# Patient Record
Sex: Female | Born: 1985 | Hispanic: No | Marital: Married | Smoking: Former smoker
Health system: Southern US, Community
[De-identification: ages and names within clinical notes are randomized; demographics above are authoritative.]

## PROBLEM LIST (undated history)

## (undated) DIAGNOSIS — G629 Polyneuropathy, unspecified: Secondary | ICD-10-CM

## (undated) DIAGNOSIS — E559 Vitamin D deficiency, unspecified: Secondary | ICD-10-CM

## (undated) DIAGNOSIS — E669 Obesity, unspecified: Secondary | ICD-10-CM

## (undated) DIAGNOSIS — T7840XA Allergy, unspecified, initial encounter: Secondary | ICD-10-CM

## (undated) DIAGNOSIS — K589 Irritable bowel syndrome without diarrhea: Secondary | ICD-10-CM

## (undated) HISTORY — DX: Obesity, unspecified: E66.9

## (undated) HISTORY — PX: CHOLECYSTECTOMY: SHX55

## (undated) HISTORY — PX: ACNE CYST REMOVAL: SUR1112

## (undated) HISTORY — DX: Irritable bowel syndrome, unspecified: K58.9

## (undated) HISTORY — DX: Vitamin D deficiency, unspecified: E55.9

## (undated) HISTORY — DX: Polyneuropathy, unspecified: G62.9

---

## 2003-03-16 DIAGNOSIS — M1990 Unspecified osteoarthritis, unspecified site: Secondary | ICD-10-CM | POA: Insufficient documentation

## 2011-11-04 ENCOUNTER — Ambulatory Visit: Payer: Self-pay | Admitting: Internal Medicine

## 2013-01-10 IMAGING — CR RIGHT FOOT COMPLETE - 3+ VIEW
1 series · 3 of 3 positions shown · non-contrast
Comparison: none

REASON FOR EXAM: pain distal 4th/5th metatarsals after twisting injury
yesterday
COMMENTS:

PROCEDURE:     MDR - MDR FOOT RT COMP W/OBLIQUES  - November 04, 2011 [DATE]
RESULT:
There is a minimally displaced oblique fracture of the distal shaft of the
fifth metatarsal. No other fractures are seen.

[Series 1: ap · 0.17mm/px · 3 of 3 slices shown]
[im 1/3]
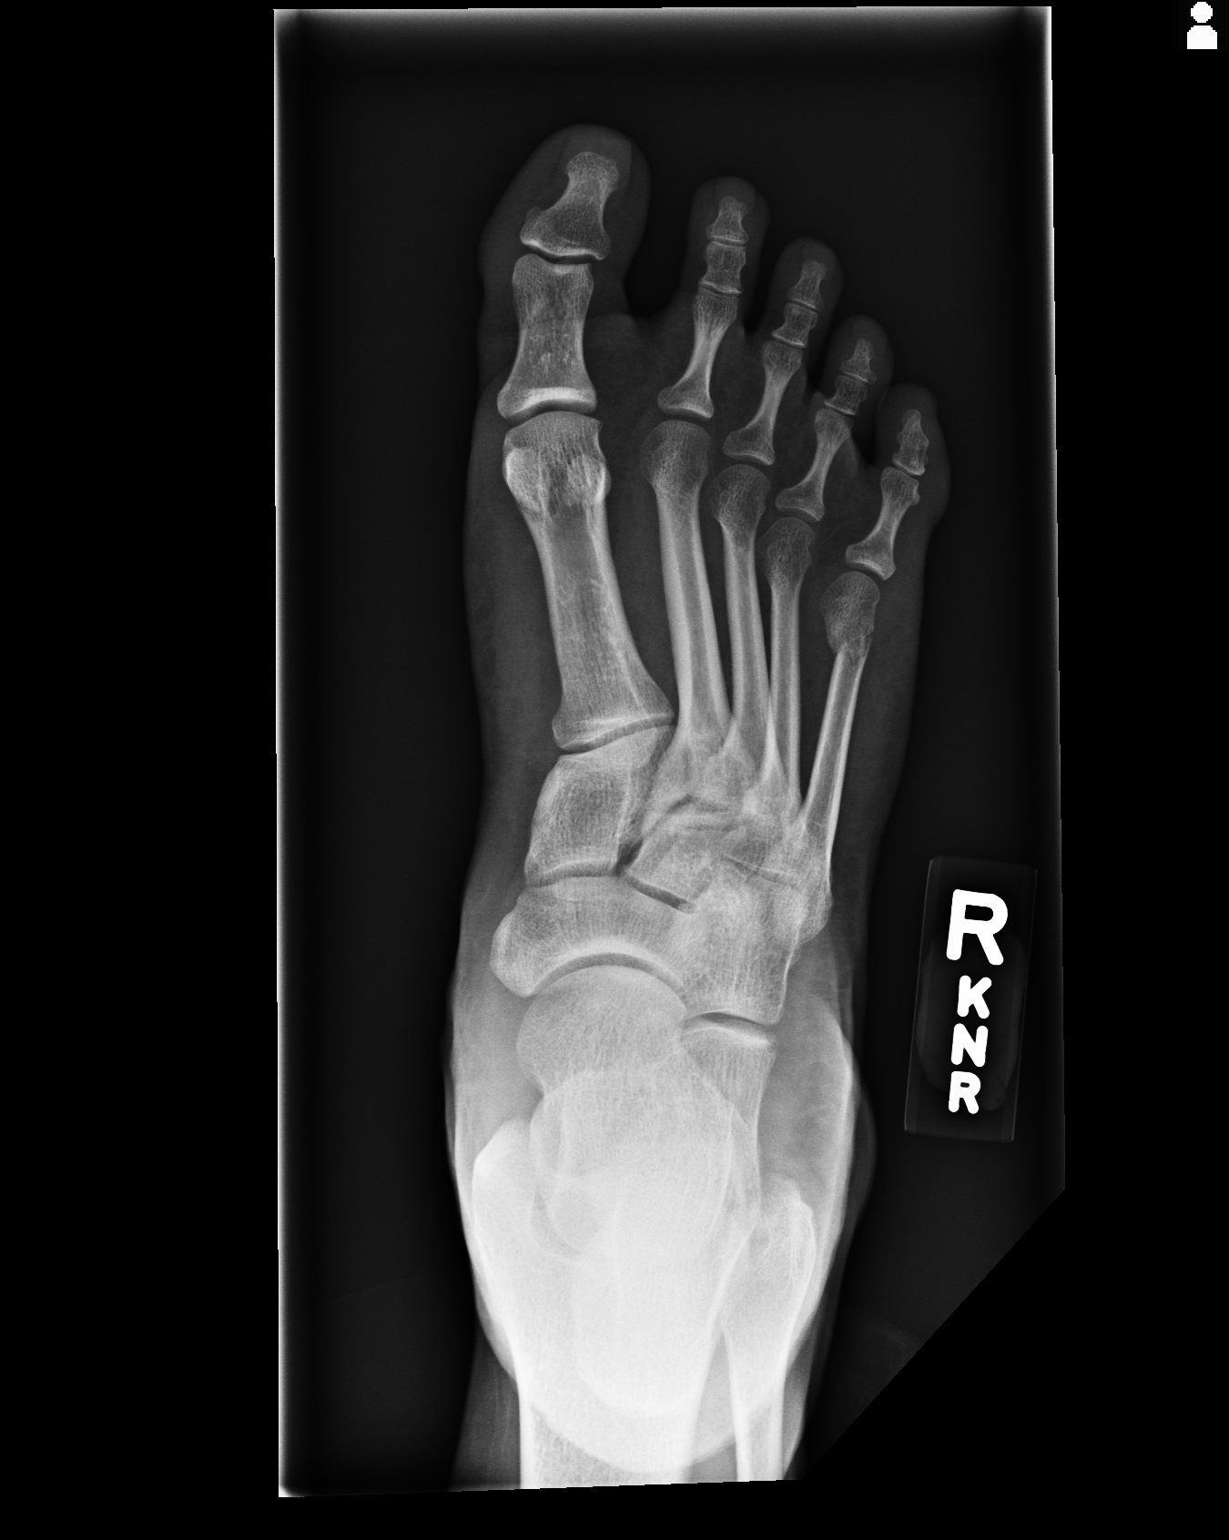
[im 2/3]
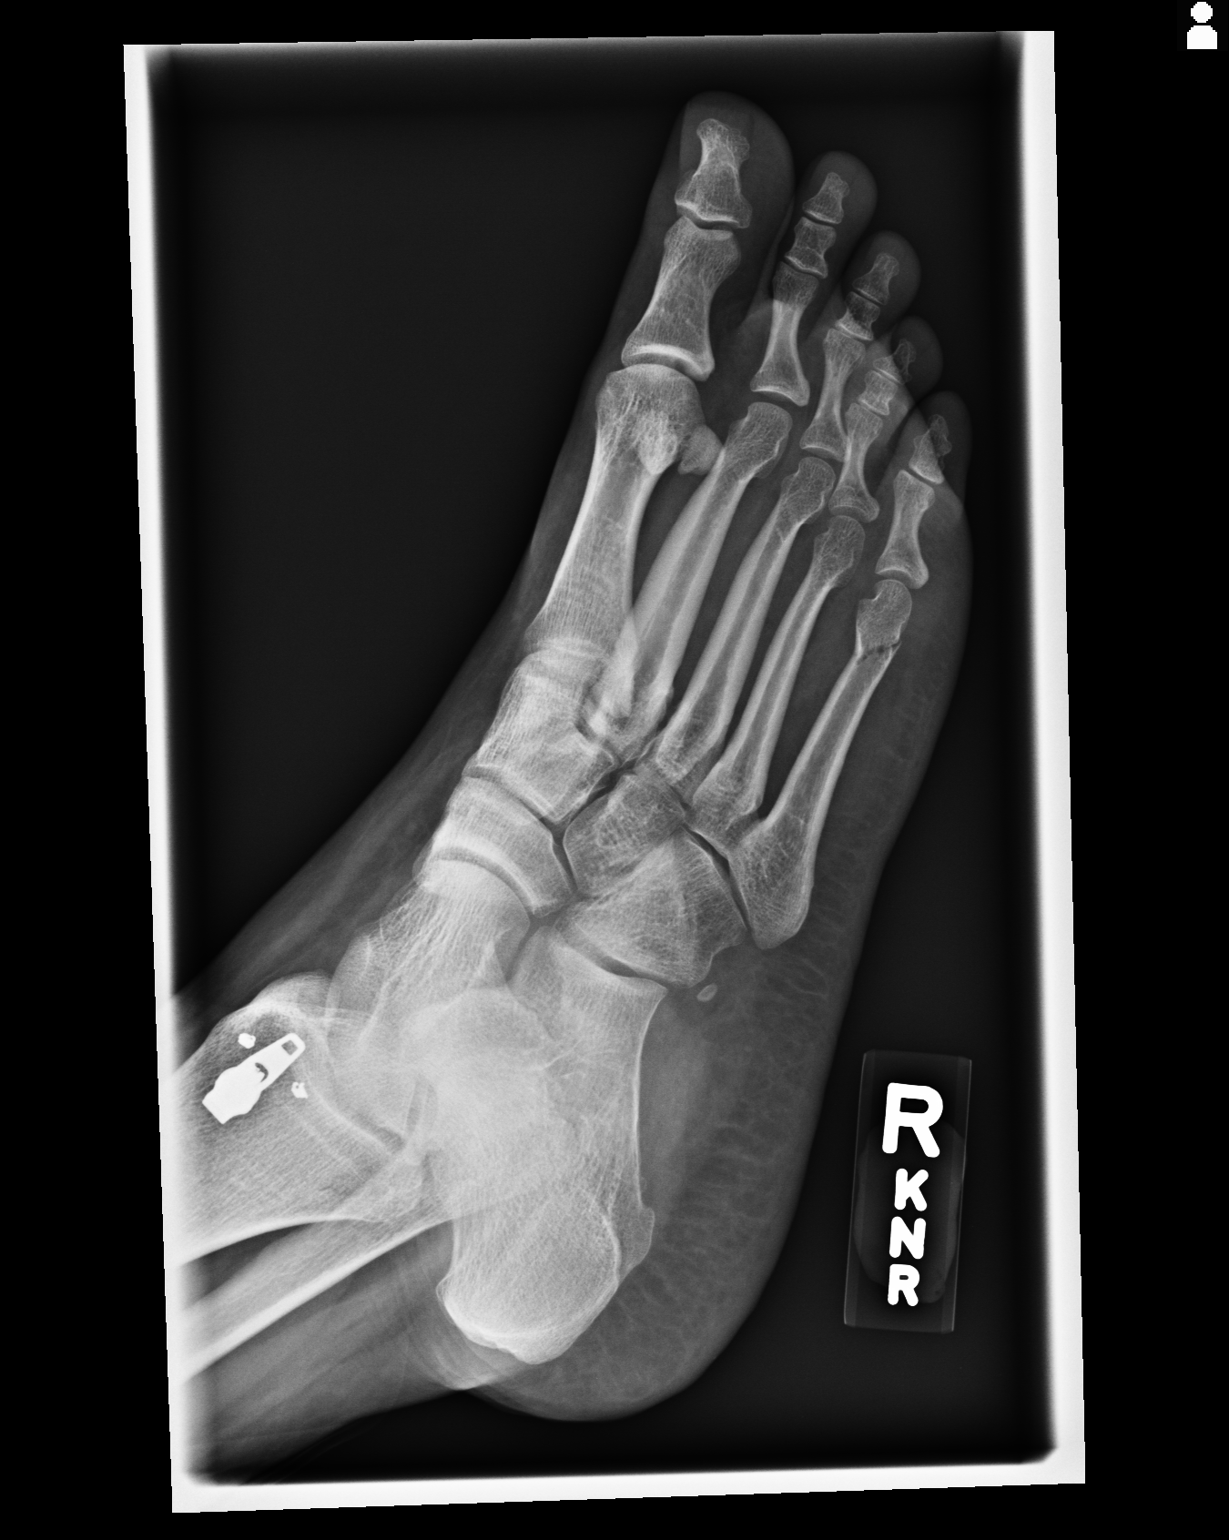
[im 3/3]
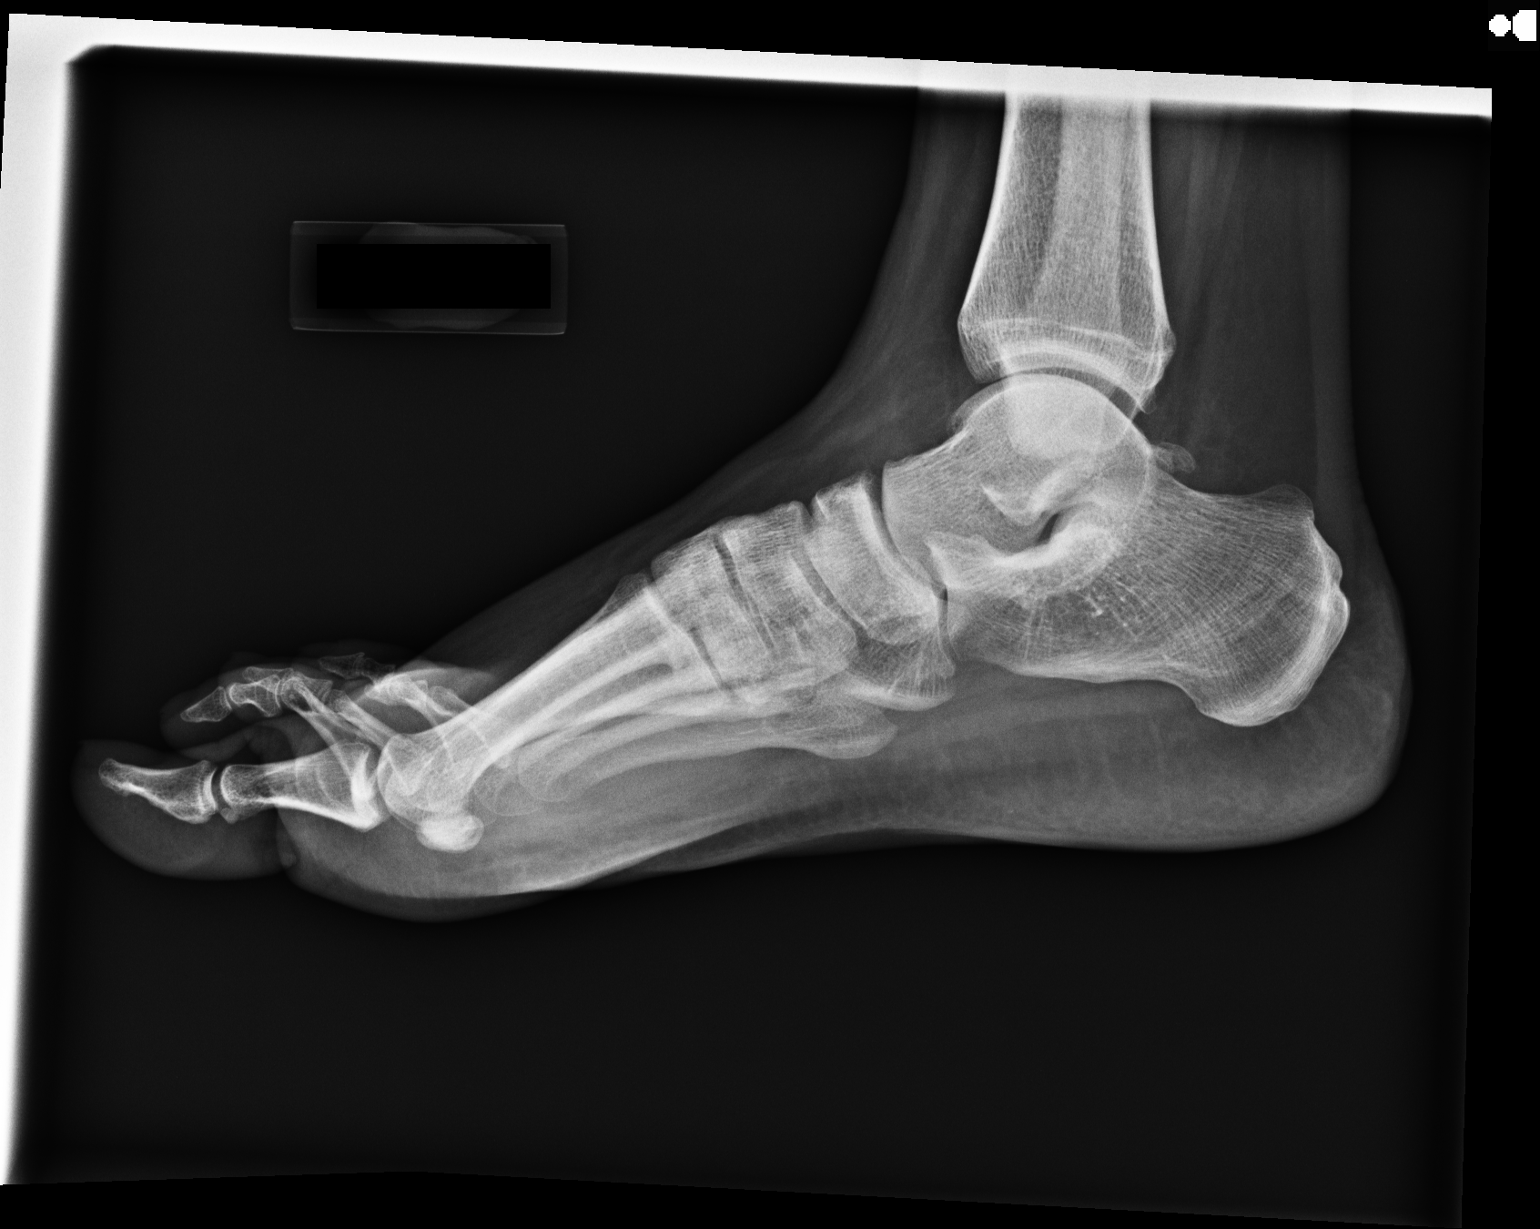

[3 of 3 positions shown; findings below may reference images not displayed]

IMPRESSION: Fracture of the distal right, fifth metatarsal, minimally
displaced.

## 2015-03-16 ENCOUNTER — Ambulatory Visit
Admission: EM | Admit: 2015-03-16 | Discharge: 2015-03-16 | Disposition: A | Discharge status: Home / Self Care | Payer: PRIVATE HEALTH INSURANCE | Attending: Physician Assistant | Admitting: Physician Assistant

## 2015-03-16 ENCOUNTER — Encounter: Payer: Self-pay | Admitting: Gynecology

## 2015-03-16 DIAGNOSIS — H109 Unspecified conjunctivitis: Secondary | ICD-10-CM | POA: Diagnosis not present

## 2015-03-16 HISTORY — DX: Allergy, unspecified, initial encounter: T78.40XA

## 2015-03-16 MED ORDER — MOXIFLOXACIN HCL 0.5 % OP SOLN: 1.0000 [drp] | Freq: Three times a day (TID) | OPHTHALMIC | Status: DC

## 2015-03-16 NOTE — ED Notes (Signed)
Per patient was seen at [facility] urgent care today. Per pt. Told she has allergies and was prescribe eye drops. Per pt. Refuse eye drops because it does not work for her. Patient stated eyes sensitive to light and painful / redness x 2 weeks.

## 2015-03-21 ENCOUNTER — Encounter: Payer: Self-pay | Admitting: Physician Assistant

## 2015-03-21 NOTE — ED Provider Notes (Signed)
CSN: [id number]     Arrival date & time 03/16/15  1825 History   None    Chief Complaint  Patient presents with  . Allergies  . Eye Pain   (Consider location/radiation/quality/duration/timing/severity/associated sxs/prior Treatment) HPI 29 yo F presents with bilaterally inflamed red eyes, clear tears draining. Reports they have looked and felt this bad for 2 weeks. Wearing contacts and has left them in. Doesn't want to use her glasses because can't see as well with them. Photophobic. Drove herself here.  Past Medical History  Diagnosis Date  . Allergy    Past Surgical History  Procedure Laterality Date  . Cholecystectomy     History reviewed. No pertinent family history. History  Substance Use Topics  . Smoking status: Former Games developermoker  . Smokeless tobacco: Not on file  . Alcohol Use: Yes     Comment: occassion   OB History    No data available     Review of Systems Review of 10 systems negative for acute change except as referenced in HPI  Allergies  Review of patient's allergies indicates no known allergies.  Home Medications   Prior to Admission medications   Medication Sig Start Date End Date Taking? Authorizing Provider  Cholecalciferol (VITAMIN D-3 PO) Take by mouth.   Yes Historical Provider, MD  Cyanocobalamin (B-12) 100 MCG TABS Take by mouth.   Yes Historical Provider, MD  fexofenadine (ALLEGRA) 180 MG tablet Take 180 mg by mouth daily.   Yes Historical Provider, MD  Multiple Vitamin (MULTIVITAMIN) capsule Take 1 capsule by mouth daily.   Yes Historical Provider, MD  triamcinolone (NASACORT AQ) 55 MCG/ACT AERO nasal inhaler Place 2 sprays into the nose daily.   Yes Historical Provider, MD  moxifloxacin (VIGAMOX) 0.5 % ophthalmic solution Place 1 drop into both eyes 3 (three) times daily. For seven days 03/16/15   Rae HalstedLaurie W Lee, PA-C   BP 128/91 mmHg  Pulse 79  Temp(Src) 99.1 F (37.3 C) (Tympanic)  Ht 5\' 8"  (1.727 m)  Wt 360 lb (163.295 kg)  BMI 54.75 kg/m2   SpO2 100%  LMP 02/14/2015 Physical Exam Constitutional -alert and oriented,in moderate distress eyes Head-atraumatic Eyes- conjunctiva inflamed bilaterally, injected, erythematous; eyelids ringed red;  EOMI ,conjugate gaze, Denies visual difficulty except decreased acuity with glasses ( routinely) Nose- no congestion or rhinorrhea Mouth/throat- mucous membranes moist , Neck- suppl CV- regular rate,  Resp-no distress, normal respiratory effort Neuro- normal speech and language,  Skin-warm,dry ,intact; no rash noted Psych-mood and affect grossly normal; speech and behavior grossly normal ED Course  Procedures (including critical care time) Eyes - bilateral instillation of tetracaine with marked improvement in discomfort. Copiously irrigated with saline. Additional single drop tetracaine bilaterally. Flurosceine. Black light. No evidence of corneal trauma or injury ; conjunctiva without uptake staining. Allergic conjunctivitis, contact lens irritation   Labs Review Labs Reviewed - No data to display  Imaging Review No results found.   MDM   1. Bilateral conjunctivitis    Diagnosis and treatment discussed.: Wear glasses only. Remove contacts and do not use until given clearance by Opthalmology -to be scheduled ASAP after the holiday weekend. Rx q 4h both eyes x 5-7 days or until clear plus one day . Suspect allergic conjunctivitis with  permeable lenses and pollen trapping with increased irritation of conjunctiva, secondary infection .  Questions fielded, expectations and recommendations reviewed. Patient expresses understanding. [facility] questions, concern or exacerbation.    Discharge Medication List as of 03/16/2015  8:18 PM    START taking these medications   Details  moxifloxacin (VIGAMOX) 0.5 % ophthalmic solution Place 1 drop into both eyes 3 (three) times daily. For seven days, Starting 03/16/2015, Until Discontinued, Print         Rae Halsted,  PA-C 03/21/15 1611

## 2015-06-06 DIAGNOSIS — J309 Allergic rhinitis, unspecified: Secondary | ICD-10-CM | POA: Insufficient documentation

## 2015-06-22 ENCOUNTER — Ambulatory Visit
Admission: RE | Admit: 2015-06-22 | Discharge: 2015-06-22 | Disposition: A | Discharge status: Home / Self Care | Payer: PRIVATE HEALTH INSURANCE | Source: Ambulatory Visit | Attending: Family Medicine | Admitting: Family Medicine

## 2015-06-22 ENCOUNTER — Other Ambulatory Visit: Payer: Self-pay | Admitting: Family Medicine

## 2015-06-22 DIAGNOSIS — R05 Cough: Secondary | ICD-10-CM

## 2015-06-22 DIAGNOSIS — R053 Chronic cough: Secondary | ICD-10-CM

## 2015-08-07 ENCOUNTER — Ambulatory Visit
Admission: EM | Admit: 2015-08-07 | Discharge: 2015-08-07 | Disposition: A | Discharge status: Home / Self Care | Payer: PRIVATE HEALTH INSURANCE | Attending: Family Medicine | Admitting: Family Medicine

## 2015-08-07 DIAGNOSIS — B349 Viral infection, unspecified: Secondary | ICD-10-CM

## 2015-08-07 DIAGNOSIS — J029 Acute pharyngitis, unspecified: Secondary | ICD-10-CM

## 2015-08-07 LAB — RAPID STREP SCREEN (MED CTR MEBANE ONLY): Streptococcus, Group A Screen (Direct): NEGATIVE

## 2015-08-07 NOTE — ED Notes (Signed)
Pt c/o sore throat with left earache since yesterday, fever last night

## 2015-08-07 NOTE — ED Provider Notes (Signed)
CSN: [id number]     Arrival date & time 08/07/15  1057 History   First MD Initiated Contact with Patient 08/07/15 1124     Chief Complaint  Patient presents with  . Sore Throat  . Otalgia   (Consider location/radiation/quality/duration/timing/severity/associated sxs/prior Treatment) HPI   This a 29 year old female who presents with a sore throat and left earache that started on Sunday and yesterday the earache became more severe. States she had a fever last night but is afebrile today in the office. States it is difficult to swallow in the left side of her throat seems to be the most sore but does extend over to the right. She has a history of seasonal and environmental allergies which is responding well to recent medications. 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 Past Medical History  Diagnosis Date  . Allergy    Past Surgical History  Procedure Laterality Date  . Cholecystectomy     History reviewed. No pertinent family history. Social History  Substance Use Topics  . Smoking status: Former Games developer  . Smokeless tobacco: None  . Alcohol Use: Yes     Comment: occassion   OB History    No data available     Review of Systems  Constitutional: Positive for fever and chills.  HENT: Positive for congestion, postnasal drip, rhinorrhea, sinus pressure, sore throat and trouble swallowing.   Eyes: Positive for pain.  Skin: Negative for rash.  Allergic/Immunologic: Positive for environmental allergies.  All other systems reviewed and are negative.   Allergies  Review of patient's allergies indicates no known allergies.  Home Medications   Prior to Admission medications   Medication Sig Start Date End Date Taking? Authorizing Provider  albuterol (PROVENTIL HFA;VENTOLIN HFA) 108 (90 BASE) MCG/ACT inhaler Inhale 2 puffs into the lungs every 4 (four) hours as needed for wheezing or shortness of breath.    Yes Historical Provider, MD  cetirizine-pseudoephedrine (ZYRTEC-D) 5-120 MG tablet Take 1 tablet by mouth 2 (two) times daily.   Yes Historical Provider, MD  montelukast (SINGULAIR) 10 MG tablet Take 10 mg by mouth at bedtime.   Yes Historical Provider, MD  ranitidine (ZANTAC) 150 MG capsule Take 150 mg by mouth 2 (two) times daily.   Yes Historical Provider, MD  Cholecalciferol (VITAMIN D-3 PO) Take by mouth.    Historical Provider, MD  Cyanocobalamin (B-12) 100 MCG TABS Take by mouth.    Historical Provider, MD  fexofenadine (ALLEGRA) 180 MG tablet Take 180 mg by mouth daily.    Historical Provider, MD  moxifloxacin (VIGAMOX) 0.5 % ophthalmic solution Place 1 drop into both eyes 3 (three) times daily. For seven days 03/16/15   Rae Halsted, PA-C  Multiple Vitamin (MULTIVITAMIN) capsule Take 1 capsule by mouth daily.    Historical Provider, MD  triamcinolone (NASACORT AQ) 55 MCG/ACT AERO nasal inhaler Place 2 sprays into the nose daily.    Historical Provider, MD   Meds Ordered and Administered this Visit  Medications - No data to display  BP 119/83 mmHg  Pulse 99  Temp(Src) 98.3 F (36.8 C) (Oral)  Resp 18  Ht  (1.702 m)  Wt 345 lb (156.491 kg)  BMI 54.02 kg/m2  SpO2 100%  LMP 07/14/2015 (Exact Date) No data found.   Physical Exam  Constitutional: She is oriented to person, place, and time. She appears well-developed and well-nourished. No distress.  HENT:  Head:  Normocephalic and atraumatic.  Right Ear: External ear normal.  Left Eardrum is injected and dull.  Eyes: Pupils are equal, round, and reactive to light. Right eye exhibits no discharge. Left eye exhibits no discharge.  Neck: Normal range of motion. Neck supple.  Pulmonary/Chest: Effort normal and breath sounds normal. No respiratory distress. She has no wheezes. She has no rales.  Musculoskeletal: Normal range of motion. She exhibits no edema or tenderness.  Lymphadenopathy:    She has no cervical adenopathy.   Neurological: She is alert and oriented to person, place, and time.  Skin: Skin is warm and dry. No rash noted. She is not diaphoretic.  Psychiatric: She has a normal mood and affect. Her behavior is normal. Judgment and thought content normal.  Nursing note and vitals reviewed.   ED Course  Procedures (including critical care time)  Labs Review Labs Reviewed  RAPID STREP SCREEN (NOT AT [facility])  CULTURE, GROUP A STREP ([facility] ONLY)    Imaging Review No results found.   Visual Acuity Review  Right Eye Distance:   Left Eye Distance:   Bilateral Distance:    Right Eye Near:   Left Eye Near:    Bilateral Near:         MDM   1. Pharyngitis with viral syndrome    Discharge Medication List as of 08/07/2015 11:53 AM    Plan: 1. Test/x-ray results and diagnosis reviewed with patient 2. rx as per orders; risks, benefits, potential side effects reviewed with patient 3. Recommend supportive treatment with salt water gargles, flonase, IBU PRN. 4. F/u prn if symptoms worsen or don't improve     Lutricia FeilWilliam P Cleland Simkins, PA-C 08/07/15 1159

## 2015-08-07 NOTE — Discharge Instructions (Signed)
Pharyngitis Pharyngitis is redness, pain, and swelling (inflammation) of your pharynx.  CAUSES  Pharyngitis is usually caused by infection. Most of the time, these infections are from viruses (viral) and are part of a cold. However, sometimes pharyngitis is caused by bacteria (bacterial). Pharyngitis can also be caused by allergies. Viral pharyngitis may be spread from person to person by coughing, sneezing, and personal items or utensils (cups, forks, spoons, toothbrushes). Bacterial pharyngitis may be spread from person to person by more intimate contact, such as kissing.  SIGNS AND SYMPTOMS  Symptoms of pharyngitis include:   Sore throat.   Tiredness (fatigue).   Low-grade fever.   Headache.  Joint pain and muscle aches.  Skin rashes.  Swollen lymph nodes.  Plaque-like film on throat or tonsils (often seen with bacterial pharyngitis). DIAGNOSIS  Your health care provider will ask you questions about your illness and your symptoms. Your medical history, along with a physical exam, is often all that is needed to diagnose pharyngitis. Sometimes, a rapid strep test is done. Other lab tests may also be done, depending on the suspected cause.  TREATMENT  Viral pharyngitis will usually get better in 3-4 days without the use of medicine. Bacterial pharyngitis is treated with medicines that kill germs (antibiotics).  HOME CARE INSTRUCTIONS   Drink enough water and fluids to keep your urine clear or pale yellow.   Only take over-the-counter or prescription medicines as directed by your health care provider:   If you are prescribed antibiotics, make sure you finish them even if you start to feel better.   Do not take aspirin.   Get lots of rest.   Gargle with 8 oz of salt water ( tsp of salt per 1 qt of water) as often as every 1-2 hours to soothe your throat.   Throat lozenges (if you are not at risk for choking) or sprays may be used to soothe your throat. SEEK MEDICAL  CARE IF:   You have large, tender lumps in your neck.  You have a rash.  You cough up green, yellow-brown, or bloody spit. SEEK IMMEDIATE MEDICAL CARE IF:   Your neck becomes stiff.  You drool or are unable to swallow liquids.  You vomit or are unable to keep medicines or liquids down.  You have severe pain that does not go away with the use of recommended medicines.  You have trouble breathing (not caused by a stuffy nose). MAKE SURE YOU:   Understand these instructions.  Will watch your condition.  Will get help right away if you are not doing well or get worse.   This information is not intended to replace advice given to you by your health care provider. Make sure you discuss any questions you have with your health care provider.   Document Released: 09/01/2005 Document Revised: 06/22/2013 Document Reviewed: 05/09/2013 Elsevier Interactive Patient Education 2016 Elsevier Inc.  Sore Throat A sore throat is pain, burning, irritation, or scratchiness of the throat. There is often pain or tenderness when swallowing or talking. A sore throat may be accompanied by other symptoms, such as coughing, sneezing, fever, and swollen neck glands. A sore throat is often the first sign of another sickness, such as a cold, flu, strep throat, or mononucleosis (commonly known as mono). Most sore throats go away without medical treatment. CAUSES  The most common causes of a sore throat include:  A viral infection, such as a cold, flu, or mono.  A bacterial infection, such as strep throat,  tonsillitis, or whooping cough. °· Seasonal allergies. °· Dryness in the air. °· Irritants, such as smoke or pollution. °· Gastroesophageal reflux disease (GERD). °HOME CARE INSTRUCTIONS  °· Only take over-the-counter medicines as directed by your caregiver. °· Drink enough fluids to keep your urine clear or pale yellow. °· Rest as needed. °· Try using throat sprays, lozenges, or sucking on hard candy to ease  any pain (if older than 4 years or as directed). °· Sip warm liquids, such as broth, herbal tea, or warm water with honey to relieve pain temporarily. You may also eat or drink cold or frozen liquids such as frozen ice pops. °· Gargle with salt water (mix 1 tsp salt with 8 oz of water). °· Do not smoke and avoid secondhand smoke. °· Put a cool-mist humidifier in your bedroom at night to moisten the air. You can also turn on a hot shower and sit in the bathroom with the door closed for 5-10 minutes. °SEEK IMMEDIATE MEDICAL CARE IF: °· You have difficulty breathing. °· You are unable to swallow fluids, soft foods, or your saliva. °· You have increased swelling in the throat. °· Your sore throat does not get better in 7 days. °· You have nausea and vomiting. °· You have a fever or persistent symptoms for more than 2-3 days. °· You have a fever and your symptoms suddenly get worse. °MAKE SURE YOU:  °· Understand these instructions. °· Will watch your condition. °· Will get help right away if you are not doing well or get worse. °  °This information is not intended to replace advice given to you by your health care provider. Make sure you discuss any questions you have with your health care provider. °  °Document Released: 10/09/2004 Document Revised: 09/22/2014 Document Reviewed: 05/09/2012 °Elsevier Interactive Patient Education ©2016 Elsevier Inc. ° °

## 2015-08-09 LAB — CULTURE, GROUP A STREP (THRC)

## 2015-11-05 DIAGNOSIS — S62615A Displaced fracture of proximal phalanx of left ring finger, initial encounter for closed fracture: Secondary | ICD-10-CM | POA: Insufficient documentation

## 2015-12-31 ENCOUNTER — Ambulatory Visit: Payer: Worker's Compensation | Attending: Student | Admitting: Occupational Therapy

## 2015-12-31 DIAGNOSIS — M25642 Stiffness of left hand, not elsewhere classified: Secondary | ICD-10-CM | POA: Diagnosis not present

## 2015-12-31 DIAGNOSIS — M79642 Pain in left hand: Secondary | ICD-10-CM | POA: Insufficient documentation

## 2015-12-31 DIAGNOSIS — M6281 Muscle weakness (generalized): Secondary | ICD-10-CM | POA: Diagnosis present

## 2015-12-31 NOTE — Patient Instructions (Signed)
Heat prior to ROM  PIP extention PROM and AROM on table   AROM DIP and PIP flexion block at 4th and 5th  tendonglides   10 reps 2-3 x day  Opposition with sliding digits one at time down thumb

## 2015-12-31 NOTE — Therapy (Signed)
[facility] PH[facility] AND SP[facility] MEDICINE [address] [place], [place], [address] Phone: [phone]   Fax:  [phone]  Occupational Therapy Treatment  Patient Details  Name: Karen Schroeder MRN: [id number] Date of Birth: 04/26/1986 Referring Provider: Marney DoctorMcGhee  Encounter Date: 12/31/2015      OT End of Session - 12/31/15 1339    Visit Number 1   Number of Visits 6   Date for OT Re-Evaluation 02/11/16   OT Start Time 0815   OT Stop Time 0857   OT Time Calculation (min) 42 min   Activity Tolerance Patient tolerated treatment well   Behavior During Therapy [facility] for tasks assessed/performed      Past Medical History  Diagnosis Date  . Allergy     Past Surgical History  Procedure Laterality Date  . Cholecystectomy      There were no vitals filed for this visit.      Subjective Assessment - 12/31/15 0856    Subjective  Fracture finger about 9 wks ago - was in splint and then buddy strap - was taken out of that on 7th April - pain getting better    Patient Stated Goals Want to get my motion better - make fist, fix my hair, pick up ojbects, cleaning like scrubbing,  tying shoes, open jar   Currently in Pain? No/denies            [facility] OT Assessment - 12/31/15 0001    Assessment   Diagnosis L 4th prox phalanges fx    Referring Provider McGhee   Onset Date 10/23/15   Assessment Pt grasp onto railing coming down stairs at client house - was seen by orthopedics  - was in extention splint , buddy strap - now refer to occupational therapy   Balance Screen   Has the patient fallen in the past 6 months No   Has the patient had a decrease in activity level because of a fear of falling?  No   Is the patient reluctant to leave their home because of a fear of falling?  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[employer], R hand dominant -  typing , on computer , table and reading a lot    Strength   Right Hand Grip (lbs) 50   Right Hand Lateral Pinch 17 lbs   Right Hand 3 Point Pinch 19 lbs   Left Hand Grip (lbs) 25   Left Hand Lateral Pinch 16 lbs   Left Hand 3 Point Pinch 16 lbs   Left Hand AROM   L Ring  MCP 0-90 72 Degrees   L Ring PIP 0-100 80 Degrees  -22   L Ring DIP 0-70 30 Degrees   L Little  MCP 0-90 85 Degrees   L Little PIP 0-100 75 Degrees         Fluidotherapy prior to review of HEP to increase ROM and decrease pain - 5th showed excellent progress - 4th needed some PROM  HEP reviewed - hand out  Provided                  OT Education - 12/31/15 1339    Education provided Yes   Education Details HEP and findings of eval    Person(s) Educated Patient  Methods Explanation;Demonstration;Tactile cues;Verbal cues;Handout   Comprehension Verbal cues required;Verbalized understanding;Returned demonstration          OT Short Term Goals - 12/31/15 1344    OT SHORT TERM GOAL #1   Title Pain on PRWHE improve with 10 points to use hand    Baseline Pain at eval 13/50   Time 2   Period Weeks   Status New   OT SHORT TERM GOAL #2   Title AROM in L 4th and 5th digits improve to [facility] for flexion and extention    Baseline MC's 72 to 85 and PIP's 75 to 80  degrees ; PIP extention -22 at 4th    Time 4   Period Weeks   Status New           OT Long Term Goals - 12/31/15 1347    OT LONG TERM GOAL #1   Title L grip strength improve with 5-10 lbs to  carry 8 lbs objects without pain    Baseline Grip L 25 ,R 50 lbs - carry about 2 lbs    Time 5   Period Weeks   Status New   OT LONG TERM GOAL #2   Title Function on PRWHE improve with 10-15 points    Baseline Function at eval 17/50    Time 6   Period Weeks   Status New               Plan - 12/31/15 1341    Clinical Impression Statement Pt present at eval about 8 wks  post proximal  phalanges fx of 4th Left - pt show decrease ROM in 5th and 4th , decrease strength and increase Pain - limiting her functional use of L hand in ADL's and IADL's    Rehab Potential Good   OT Frequency 1x / week   OT Duration 6 weeks   OT Treatment/Interventions Self-care/ADL training;Fluidtherapy;Parrafin;Patient/family education;Splinting;Manual Therapy;Passive range of motion   Plan assess progress - if need buddy sstrap to increase MC flexion - and edema still good   OT Home Exercise Plan See pt instruction    Consulted and Agree with Plan of Care Patient      Patient will benefit from skilled therapeutic intervention in order to improve the following deficits and impairments:  Decreased coordination, Decreased range of motion, Impaired flexibility, Pain, Impaired UE functional use, Decreased strength  Visit Diagnosis: Stiffness of left hand, not elsewhere classified - Plan: Ot plan of care cert/re-cert  Pain in left hand - Plan: Ot plan of care cert/re-cert  Muscle weakness (generalized) - Plan: Ot plan of care cert/re-cert    Problem List There are no active problems to display for this patient.   Oletta Cohn OTR/l,CLT  12/31/2015, 1:51 PM  [place] [facility] PH[facility]ICAL AND SPORTS MEDICINE [address], [place], [address] Phone: [phone]   Fax:  [phone]  Name: Karen Schroeder MRN: [id number] Date of Birth: [birth year]

## 2016-01-09 ENCOUNTER — Ambulatory Visit: Payer: Worker's Compensation | Admitting: Occupational Therapy

## 2016-01-15 ENCOUNTER — Ambulatory Visit: Payer: Worker's Compensation | Attending: Student | Admitting: Occupational Therapy

## 2016-01-15 DIAGNOSIS — M25642 Stiffness of left hand, not elsewhere classified: Secondary | ICD-10-CM

## 2016-01-15 DIAGNOSIS — M6281 Muscle weakness (generalized): Secondary | ICD-10-CM | POA: Diagnosis present

## 2016-01-15 DIAGNOSIS — M79642 Pain in left hand: Secondary | ICD-10-CM | POA: Diagnosis present

## 2016-01-15 NOTE — Therapy (Signed)
[place] [facility]ICINE [address] [place], [address] Phone: [phone]   Fax:  [phone]  Occupational Therapy Treatment  Patient Details  Name: Karen Schroeder MRN: [id number] Date of Birth: 1986/08/03 Referring Provider: Marney Doctor  Encounter Date: 01/15/2016      OT End of Session - 01/15/16 1148    Visit Number 2   Number of Visits 6   Date for OT Re-Evaluation 02/11/16   OT Start Time 1040   OT Stop Time 1130   OT Time Calculation (min) 50 min   Activity Tolerance Patient tolerated treatment well   Behavior During Therapy [facility] for tasks assessed/performed      Past Medical History  Diagnosis Date  . Allergy     Past Surgical History  Procedure Laterality Date  . Cholecystectomy      There were no vitals filed for this visit.      Subjective Assessment - 01/15/16 1043    Subjective  Pain is better - did not had as much time as  Iwanted - pinkie feels like worse - using it more - can turn key easier , sweeping -   Patient Stated Goals Want to get my motion better - make fist, fix my hair, pick up ojbects, cleaning like scrubbing,  tying shoes, open jar   Currently in Pain? No/denies            [facility] OT Assessment - 01/15/16 0001    Strength   Left Hand Grip (lbs) 31   Left Hand AROM   L Ring  MCP 0-90 75 Degrees   L Ring PIP 0-100 85 Degrees  -15   L Ring DIP 0-70 50 Degrees   L Little  MCP 0-90 90 Degrees   L Little PIP 0-100 80 Degrees                  OT Treatments/Exercises (OP) - 01/15/16 0001    LUE Paraffin   Number Minutes Paraffin 10 Minutes   LUE Paraffin Location Hand   Comments Coban wrap into flexion 4th  - with heatinpad around  at [facility] to increse MC flexion       Composite flexion of 4th and 5th digit Pt ed on doing at doing at home    AROM MC flexion with PIP extention and  pressure on proximal phalanges - to increase PIP extention  AROM intrinsic fist -  some pain at 4th PIP Full fist AROM to bottom of palm   reintegrate to do flexion to bottom of palm  Teal putty for gripping - 15 reps   Pulling with all digits 10 reps  Fitted with buddy strap on proximal phalanges of 4th to 3rd to wear on and off during day to increase flexion at 4th [facility] during day               OT Education - 01/15/16 1101    Education provided Yes   Education Details HEP     Person(s) Educated Patient   Methods Explanation;Demonstration;Tactile cues;Verbal cues   Comprehension Verbalized understanding;Returned demonstration          OT Short Term Goals - 12/31/15 1344    OT SHORT TERM GOAL #1   Title Pain on PRWHE improve with 10 points to use hand    Baseline Pain at eval 13/50   Time 2   Period Weeks   Status New   OT SHORT TERM GOAL #2  Title AROM in L 4th and 5th digits improve to [facility] for flexion and extention    Baseline MC's 72 to 85 and PIP's 75 to 80  degrees ; PIP extention -22 at 4th    Time 4   Period Weeks   Status New           OT Long Term Goals - 12/31/15 1347    OT LONG TERM GOAL #1   Title L grip strength improve with 5-10 lbs to  carry 8 lbs objects without pain    Baseline Grip L 25 ,R 50 lbs - carry about 2 lbs    Time 5   Period Weeks   Status New   OT LONG TERM GOAL #2   Title Function on PRWHE improve with 10-15 points    Baseline Function at eval 17/50    Time 6   Period Weeks   Status New               Plan - 01/15/16 1148    Clinical Impression Statement Pt showed increase ROM in 4th and 5th except [facility] - upgraded HEP this date and did add putty for gripping - but to keep pain under 2/10    Rehab Potential Good   OT Frequency 1x / week   OT Duration 4 weeks   OT Treatment/Interventions Self-care/ADL training;Fluidtherapy;Parrafin;Patient/family education;Splinting;Manual Therapy;Passive range of motion   Plan assess ROM , update HEP as needed    OT Home Exercise Plan See pt  instruction    Consulted and Agree with Plan of Care Patient      Patient will benefit from skilled therapeutic intervention in order to improve the following deficits and impairments:  Decreased coordination, Decreased range of motion, Impaired flexibility, Pain, Impaired UE functional use, Decreased strength  Visit Diagnosis: Stiffness of left hand, not elsewhere classified  Pain in left hand  Muscle weakness (generalized)    Problem List There are no active problems to display for this patient.   [name], Kerryn Tennant OTR/L,CLT  01/15/2016, 11:50 AM  [place] [facility] [address] [place], [place], [address] Phone: [phone]   Fax:  [phone]  Name: Karen PomfretSherea D Schroeder MRN: [id number] Date of Birth: 10/27/1985

## 2016-01-15 NOTE — Patient Instructions (Signed)
Add to HEP Composite flexion of 4th and 5th digit   AROM MC flexion with PIP extention and  pressure on proximal phalanges - to increase PIP extention  AROM intrinsic fist  Full fist AROM to bottom of palm   reintegrate to do flexion to bottom of palm  Teal putty for gripping - 15 reps   Pulling with all digits 10 reps  Fitted with buddy strap on proximal phalanges of 4th to 3rd to wear on and off during day to increase flexion at 4th [facility] during day

## 2016-01-24 ENCOUNTER — Ambulatory Visit: Payer: Worker's Compensation | Admitting: Occupational Therapy

## 2016-01-24 DIAGNOSIS — M6281 Muscle weakness (generalized): Secondary | ICD-10-CM

## 2016-01-24 DIAGNOSIS — M79642 Pain in left hand: Secondary | ICD-10-CM

## 2016-01-24 DIAGNOSIS — M25642 Stiffness of left hand, not elsewhere classified: Secondary | ICD-10-CM

## 2016-01-24 NOTE — Patient Instructions (Addendum)
HEP cont with Composite flexion of 4th and 5th digit PROM   Place and hold composite fist - add to HEP  - pt as reporting fingers cross if trying to do composite fist    AROM MC flexion with PIP extention and pressure on proximal phalanges - to increase PIP extention  AROM intrinsic fist - - PROM for intrinsic fist to 5th prior to AROM( can hold 3 x 30 sec    reintegrate to do flexion to bottom of palm  Teal putty for gripping - 15 reps  Pulling with ulnar side of hand add to HEP     10-15 reps   pain should be less than 2/10  Cont  with buddy strap on proximal phalanges of 4th to 3rd to wear on and off during day to increase flexion at 4th [facility] during day

## 2016-01-24 NOTE — Therapy (Signed)
[place] [facility]ICINE [address], [place], [address] Phone: [phone]   Fax:  [phone]  Occupational Therapy Treatment  Patient Details  Name: FLORINDA TAFLINGER MRN: [id number] Date of Birth: 07/06/1986 Referring Provider: Marney Doctor  Encounter Date: 01/24/2016      OT End of Session - 01/24/16 1157    Visit Number 3   Number of Visits 6   Date for OT Re-Evaluation 02/11/16   OT Start Time 1140   OT Stop Time 1219   OT Time Calculation (min) 39 min   Activity Tolerance Patient tolerated treatment well   Behavior During Therapy [facility] for tasks assessed/performed      Past Medical History  Diagnosis Date  . Allergy     Past Surgical History  Procedure Laterality Date  . Cholecystectomy      There were no vitals filed for this visit.      Subjective Assessment - 01/24/16 1154    Subjective  Pain is better and I am using it more - can turn key in lock - but pinkie still stiff - tieing shoes easier and scrubbing/sweeping    Patient Stated Goals Want to get my motion better - make fist, fix my hair, pick up ojbects, cleaning like scrubbing,  tying shoes, open jar   Currently in Pain? No/denies            [facility] OT Assessment - 01/24/16 0001    Strength   Right Hand Grip (lbs) 50   Right Hand Lateral Pinch 20 lbs   Right Hand 3 Point Pinch 19 lbs   Left Hand Grip (lbs) 31   Left Hand Lateral Pinch 19 lbs   Left Hand 3 Point Pinch 19 lbs                  OT Treatments/Exercises (OP) - 01/24/16 0001    LUE Paraffin   Number Minutes Paraffin 10 Minutes   LUE Paraffin Location Hand   Comments Coban wrap 4th into flexion to increase MC flexion - at [facility]       Composite flexion of 4th and 5th digit PROM  Review again for HEP to do  Place and hold composite fist - add to HEP  - pt as reporting fingers cross if trying to do composite fist    AROM MC flexion with PIP extention and pressure  on proximal phalanges - to increase PIP extention  AROM intrinsic fist - - PROM for intrinsic fist to 5th prior to AROM   reintegrate to do flexion to bottom of palm  Teal putty for gripping - 15 reps  Pulling with ulnar side of hand add to HEP   pt did skip some of putty HEP from last time- reviewed again  That should increase grip for last time  10-15 reps   pain should be less than 2/10  Cont  with buddy strap on proximal phalanges of 4th to 3rd to wear on and off during day to increase flexion at 4th [facility] during day              OT Education - 01/24/16 1157    Education provided Yes   Education Details HEP   Person(s) Educated Patient   Methods Explanation;Demonstration;Tactile cues;Verbal cues   Comprehension Returned demonstration;Verbalized understanding          OT Short Term Goals - 01/24/16 1225    OT SHORT TERM GOAL #1   Title  Pain on PRWHE improve with 10 points to use hand    Baseline Pain at eval 13/50 - only with gripping putty and PROM pain about 2-3/10   Time 2   Period Weeks   Status On-going   OT SHORT TERM GOAL #2   Title AROM in L 4th and 5th digits improve to [facility] for flexion and extention    Baseline see flowsheet - improving   Time 3   Period Weeks   Status On-going           OT Long Term Goals - 01/24/16 1226    OT LONG TERM GOAL #1   Title L grip strength improve with 5-10 lbs to  carry 8 lbs objects without pain    Baseline Grip improved to 31 -R 50   Time 3   Period Weeks   Status On-going   OT LONG TERM GOAL #2   Title Function on PRWHE improve with 10-15 points    Baseline report increase use    Time 4   Period Weeks   Status On-going               Plan - 01/24/16 1157    Clinical Impression Statement Pt cont to show every visit improvement in AROM at 4th and 5th - PIP and MC - pt report pain at the most 2/10 with composite flexion and putty -pt putty resistance not increase  - pt was not doing gripping -  add to HEP as well as place and hold -  and cont  buddytrap on and off to increaes Mc 4th flexion    Rehab Potential Good   OT Frequency 1x / week   OT Duration 4 weeks   OT Treatment/Interventions Self-care/ADL training;Fluidtherapy;Parrafin;Patient/family education;Splinting;Manual Therapy;Passive range of motion   Plan assess progress in ROM and grip - upgrade putty if needed    OT Home Exercise Plan See pt instruction    Consulted and Agree with Plan of Care Patient      Patient will benefit from skilled therapeutic intervention in order to improve the following deficits and impairments:  Decreased coordination, Decreased range of motion, Impaired flexibility, Pain, Impaired UE functional use, Decreased strength  Visit Diagnosis: Stiffness of left hand, not elsewhere classified  Pain in left hand  Muscle weakness (generalized)    Problem List There are no active problems to display for this patient.   [name], Tonantzin Mimnaugh OTR/L,CLT  01/24/2016, 12:27 PM  [place] [facility]TS MEDICINE [address] [place], [place], [address] Phone: [phone]   Fax:  [phone]  Name: Emmit PomfretSherea D Nies MRN: [id number] Date of Birth: 01/10/1986

## 2016-02-01 ENCOUNTER — Ambulatory Visit: Payer: Worker's Compensation | Admitting: Occupational Therapy

## 2016-02-01 DIAGNOSIS — M25642 Stiffness of left hand, not elsewhere classified: Secondary | ICD-10-CM | POA: Diagnosis not present

## 2016-02-01 DIAGNOSIS — M6281 Muscle weakness (generalized): Secondary | ICD-10-CM

## 2016-02-01 DIAGNOSIS — M79642 Pain in left hand: Secondary | ICD-10-CM

## 2016-02-01 NOTE — Patient Instructions (Signed)
Pt to do heat , PROM composite flexion 4th and 5th  place and hold  AROM into palm composite   PUtty grip only  2 x day

## 2016-02-01 NOTE — Therapy (Signed)
[place] [facility]ORTS MEDICINE [address] [place], [address] Phone: [phone]   Fax:  [phone]  Occupational Therapy Treatment  Patient Details  Name: Karen Schroeder MRN: [id number] Date of Birth: June 09, 1986 Referring Provider: Marney Doctor  Encounter Date: 02/01/2016      OT End of Session - 02/01/16 1309    Visit Number 4   Number of Visits 6   Date for OT Re-Evaluation 02/11/16   OT Start Time 1230   OT Stop Time 1308   OT Time Calculation (min) 38 min   Activity Tolerance Patient tolerated treatment well   Behavior During Therapy [facility] for tasks assessed/performed      Past Medical History  Diagnosis Date  . Allergy     Past Surgical History  Procedure Laterality Date  . Cholecystectomy      There were no vitals filed for this visit.      Subjective Assessment - 02/01/16 1308    Subjective  Had some more pain since last time - but I think I over did it with putty - pain at back of ring finger    Patient Stated Goals Want to get my motion better - make fist, fix my hair, pick up ojbects, cleaning like scrubbing,  tying shoes, open jar   Currently in Pain? Yes   Pain Score 1    Pain Location Finger (Comment which one)   Pain Orientation Left   Pain Descriptors / Indicators Aching            [facility] OT Assessment - 02/01/16 0001    Strength   Right Hand Grip (lbs) 50   Left Hand Grip (lbs) 30   Left Hand AROM   L Ring  MCP 0-90 70 Degrees  80 after heat   L Ring PIP 0-100 95 Degrees   L Little  MCP 0-90 90 Degrees   L Little PIP 0-100 83 Degrees  90 after heat                  OT Treatments/Exercises (OP) - 02/01/16 0001    LUE Paraffin   Number Minutes Paraffin 10 Minutes   LUE Paraffin Location Hand   Comments At [facility] to increase rom and decrease pain        Composite flexion of 4th and 5th digit PROM   Place and hold composite fist - no crossing over of digits anymore -  stop buddy strap    AROM MC flexion with PIP extention and pressure on proximal phalanges - to increase PIP extention  - add 5th PIP   AROM to full fist reintegrate to do flexion to bottom of palm  Teal putty for gripping - 15 reps  Only no pulling  Keep pain under 2/10 Pt to pay attention that she get [facility] of 4th flexion below 3rd - and PIP flexion at 5th full           OT Education - 02/01/16 1309    Education provided Yes   Education Details HEP   Person(s) Educated Patient   Methods Explanation;Demonstration;Tactile cues;Verbal cues   Comprehension Verbal cues required;Returned demonstration;Verbalized understanding          OT Short Term Goals - 01/24/16 1225    OT SHORT TERM GOAL #1   Title Pain on PRWHE improve with 10 points to use hand    Baseline Pain at eval 13/50 - only with gripping putty and PROM pain about  2-3/10   Time 2   Period Weeks   Status On-going   OT SHORT TERM GOAL #2   Title AROM in L 4th and 5th digits improve to [facility]WFL for flexion and extention    Baseline see flowsheet - improving   Time 3   Period Weeks   Status On-going           OT Long Term Goals - 01/24/16 1226    OT LONG TERM GOAL #1   Title L grip strength improve with 5-10 lbs to  carry 8 lbs objects without pain    Baseline Grip improved to 31 -R 50   Time 3   Period Weeks   Status On-going   OT LONG TERM GOAL #2   Title Function on PRWHE improve with 10-15 points    Baseline report increase use    Time 4   Period Weeks   Status On-going               Plan - 02/01/16 1310    Clinical Impression Statement Pt report increase pain with putty use - and showed decrease PIP flexion at 4th - pt to do PROM keep MC flexion during composite and then place and hold - focus this 2 wks on MC of 4th and PIP at 5th - and putty only gripping no pulling - phone me week of 6th JUne to  touch base how doing    Rehab Potential Good   OT Frequency Biweekly   OT Duration 2  weeks   OT Treatment/Interventions Self-care/ADL training;Fluidtherapy;Parrafin;Patient/family education;Splinting;Manual Therapy;Passive range of motion   Plan pt to phone week of 6 June to let me know how she is doing with HEP   OT Home Exercise Plan See pt instruction    Consulted and Agree with Plan of Care Patient      Patient will benefit from skilled therapeutic intervention in order to improve the following deficits and impairments:  Decreased coordination, Decreased range of motion, Impaired flexibility, Pain, Impaired UE functional use, Decreased strength  Visit Diagnosis: Stiffness of left hand, not elsewhere classified  Pain in left hand  Muscle weakness (generalized)    Problem List There are no active problems to display for this patient.   Karen Schroeder, Karen Schroeder,CLT  02/01/2016, 1:13 PM  [place] [facility] [address] [place], [place], [address] Phone: [phone]   Fax:  [phone]  Name: Karen PomfretSherea D Schroeder MRN: [id number] Date of Birth: 09/17/1985

## 2016-03-10 ENCOUNTER — Ambulatory Visit: Payer: Worker's Compensation | Attending: Student | Admitting: Occupational Therapy

## 2016-03-10 DIAGNOSIS — M79642 Pain in left hand: Secondary | ICD-10-CM | POA: Diagnosis present

## 2016-03-10 DIAGNOSIS — M25642 Stiffness of left hand, not elsewhere classified: Secondary | ICD-10-CM | POA: Insufficient documentation

## 2016-03-10 DIAGNOSIS — M6281 Muscle weakness (generalized): Secondary | ICD-10-CM | POA: Insufficient documentation

## 2016-03-10 NOTE — Patient Instructions (Signed)
Pt to do every 2 hrs ROM - but devided  3 sessions in flexion and other 3 in extention  Keep pain 1/10   Heat 5-8310min, LMB splint for PIP extention of 4th - but only 1-3 min  AROM into table , blocked proximal phalanges during full extention and lumbrical to increase lever - and increase PIP extetion  10reps each   Then flexion  PROM PIP/DIP stretch 4th and 5th  AROM intrinsic fist Composite flexion

## 2016-03-10 NOTE — Therapy (Signed)
[place] [facility] MEDICINE [address] [place], [place], [address] Phone: [phone]   Fax:  [phone]  Occupational Therapy Treatment  Patient Details  Name: Karen PomfretSherea D Schroeder MRN: [id number] Date of Birth: 04/21/1986 Referring Provider: Marney DoctorMcGhee  Encounter Date: 03/10/2016      OT End of Session - 03/10/16 0921    Visit Number 5   Number of Visits 10   Date for OT Re-Evaluation 04/07/16   OT Start Time 0906   OT Stop Time 0947   OT Time Calculation (min) 41 min   Activity Tolerance Patient tolerated treatment well   Behavior During Therapy [facility] for tasks assessed/performed      Past Medical History  Diagnosis Date  . Allergy     Past Surgical History  Procedure Laterality Date  . Cholecystectomy      There were no vitals filed for this visit.      Subjective Assessment - 03/10/16 0910    Subjective  Seen MD - and they said maybe arthritis - and maybe where fractue was - going to be my motions-but want more motion - still have dull ache    Patient Stated Goals Want to get my motion better - make fist, fix my hair, pick up ojbects, cleaning like scrubbing,  tying shoes, open jar   Currently in Pain? Yes   Pain Score 1    Pain Location Finger (Comment which one)   Pain Orientation Left   Pain Descriptors / Indicators Aching            [facility] OT Assessment - 03/10/16 0001    Left Hand AROM   L Ring  MCP 0-90 80 Degrees   L Ring PIP 0-100 90 Degrees   L Ring DIP 0-70 55 Degrees   L Little  MCP 0-90 95 Degrees   L Little PIP 0-100 85 Degrees           measurements taken first - and then again after fluido with AROM - did increase 5 degrees        OT Treatments/Exercises (OP) - 03/10/16 0001    LUE Fluidotherapy   Number Minutes Fluidotherapy 10 Minutes   LUE Fluidotherapy Location Hand   Comments after measurements prior to ther ex - increase AROM and decrease pain - showed increase flexion of 5  degrees at 4th and 5th     AROM tendon glides  Done - with AROM for flexion  Fitted with LMB splint on PIP 4th for extention -  3 min with no increase pain - increase extention after wards and with AROM in lumbrical fist and full extention  Did not feel stretch with banana splints  Pt to stabilize proximal phalanges during both extnetion - not to over stretch volar plate   HEP change   Pt to do every 2 hrs ROM - but devided  3 sessions in flexion and other 3 in extention  Keep pain 1/10   Heat 5-5110min, LMB splint for PIP extention of 4th - but only 1-3 min  AROM into table , blocked proximal phalanges during full extention and lumbrical to increase lever - and increase PIP extetion  10reps each   Then flexion  PROM PIP/DIP stretch 4th and 5th  AROM intrinsic fist Composite flexio           OT Education - 03/10/16 0921    Education provided Yes   Education Details HEP    Person(s) Educated Patient  Methods Explanation;Demonstration;Tactile cues;Verbal cues;Handout   Comprehension Verbal cues required;Returned demonstration;Verbalized understanding          OT Short Term Goals - 03/10/16 1041    OT SHORT TERM GOAL #1   Title Pain on PRWHE improve with 10 points to use hand    Baseline Pain at eval 13/50 and now 9/50 - still with pulling or lifting objects    Time 2   Period Weeks   Status On-going   OT SHORT TERM GOAL #2   Title AROM in L 4th and 5th digits improve to [facility] for flexion and extention    Baseline Extention of 4th PIP limited , flexion of 4th PIP and MC - and 5th PIP - see flowsheet   Time 3   Period Weeks   Status On-going           OT Long Term Goals - 03/10/16 1042    OT LONG TERM GOAL #1   Title L grip strength improve with 5-10 lbs to  carry 8 lbs objects without pain    Baseline Grip improved to 31 -R 50 - need to assess next session    Time 4   Period Weeks   Status On-going   OT LONG TERM GOAL #2   Title Function on PRWHE improve  with 10-15 points    Baseline Function score 4/50 now   Status Achieved               Plan - 03/10/16 0926    Clinical Impression Statement Pt showed increase flexion at 4th , but decrease flexion at 5th PIP and decrease extention at PIP of 4th - some pain with rest on dorsal  proximal phalanges - and then with ROM and PROM pull on volar plate and  dorsal pain better - pt fitted with banana splint for low  grade extention stretch to wear at times prior to ROM - no edema measured - pt to hold off on putty still    Rehab Potential Good   OT Frequency 2x / week   OT Duration 4 weeks   OT Treatment/Interventions Self-care/ADL training;Fluidtherapy;Parrafin;Patient/family education;Splinting;Manual Therapy;Passive range of motion   OT Home Exercise Plan See pt instruction    Consulted and Agree with Plan of Care Patient      Patient will benefit from skilled therapeutic intervention in order to improve the following deficits and impairments:  Decreased coordination, Decreased range of motion, Impaired flexibility, Pain, Impaired UE functional use, Decreased strength  Visit Diagnosis: Pain in left hand - Plan: Ot plan of care cert/re-cert  Stiffness of left hand, not elsewhere classified - Plan: Ot plan of care cert/re-cert  Muscle weakness (generalized) - Plan: Ot plan of care cert/re-cert    Problem List There are no active problems to display for this patient.   Karen Schroeder, Karen Schroeder OTR/L,CLT 03/10/2016, 1:42 PM  [place] [facility]TS MEDICINE [address] [place], [place], [address] Phone: [phone]   Fax:  [phone]  Name: Karen PomfretSherea D Schroeder MRN: [id number] Date of Birth: 12/14/1985

## 2016-03-13 ENCOUNTER — Ambulatory Visit: Payer: Worker's Compensation | Admitting: Occupational Therapy

## 2016-03-13 DIAGNOSIS — M79642 Pain in left hand: Secondary | ICD-10-CM

## 2016-03-13 DIAGNOSIS — M25642 Stiffness of left hand, not elsewhere classified: Secondary | ICD-10-CM

## 2016-03-13 DIAGNOSIS — M6281 Muscle weakness (generalized): Secondary | ICD-10-CM

## 2016-03-13 NOTE — Therapy (Signed)
[place] [facility] [address] , [place], [address] Phone: [phone]   Fax:  [phone]  Occupational Therapy Treatment  Patient Details  Name: Karen PomfretSherea D Kroenke MRN: [id number] Date of Birth: 09/12/1986 Referring Provider: Marney DoctorMcGhee  Encounter Date: 03/13/2016      OT End of Session - 03/13/16 0922    Visit Number 6   Number of Visits 10   Date for OT Re-Evaluation 04/07/16   OT Start Time 0815   OT Stop Time 0850   OT Time Calculation (min) 35 min   Activity Tolerance Patient tolerated treatment well   Behavior During Therapy [facility] for tasks assessed/performed      Past Medical History  Diagnosis Date  . Allergy     Past Surgical History  Procedure Laterality Date  . Cholecystectomy      There were no vitals filed for this visit.      Subjective Assessment - 03/13/16 0830    Subjective  Doing okay - splint for about 5 min - had pain in finger yesterday but the day before was on call and worked a lot over time -    Patient Stated Goals Want to get my motion better - make fist, fix my hair, pick up ojbects, cleaning like scrubbing,  tying shoes, open jar   Currently in Pain? No/denies            [facility] - 03/13/16 0001    Left Hand AROM   L Ring  MCP 0-90 80 Degrees   L Ring PIP 0-100 95 Degrees  -10   L Little  MCP 0-90 95 Degrees   L Little PIP 0-100 90 Degrees                  OT Treatments/Exercises (OP) - 03/13/16 0001    LUE Fluidotherapy   Number Minutes Fluidotherapy 10 Minutes   LUE Fluidotherapy Location Hand   Comments AT SOC to increase flexion and decrease pain      Gentle stretch for PIP extention 4th and 5th  Flexion 4th MC   AROM tendon glides    Stabilize proximal phalanges during full extention and lumbrical fist to increase PIP extention - and prevent  over stretch of volar plate     Pt to showed increase PIP extention  [facility]  [facility] -  but able to keep it extended during lumbrical fist after blocked and tendon glides and PROM    Keep pain 1/10   Did some gentle PROM and traction on 5th PIP and 4th MC   Then flexion composite flexion of full fist - pt do have longer nails but compare to R she has full AROM             OT Education - 03/13/16 [id number]    Education provided Yes   Education Details HEP   Person(s) Educated Patient   Methods Explanation;Demonstration;Tactile cues;Verbal cues   Comprehension Returned demonstration;Verbal cues required;Verbalized understanding          OT Short Term Goals - 03/10/16 1041    OT SHORT TERM GOAL #1   Title Pain on PRWHE improve with 10 points to use hand    Baseline Pain at eval 13/50 and now 9/50 - still with pulling or lifting objects    Time 2   Period Weeks   Status On-going   OT SHORT TERM GOAL #2   Title AROM in L 4th  and 5th digits improve to [facility] for flexion and extention    Baseline Extention of 4th PIP limited , flexion of 4th PIP and MC - and 5th PIP - see flowsheet   Time 3   Period Weeks   Status On-going           OT Long Term Goals - 03/10/16 1042    OT LONG TERM GOAL #1   Title L grip strength improve with 5-10 lbs to  carry 8 lbs objects without pain    Baseline Grip improved to 31 -R 50 - need to assess next session    Time 4   Period Weeks   Status On-going   OT LONG TERM GOAL #2   Title Function on PRWHE improve with 10-15 points    Baseline Function score 4/50 now   Status Achieved               Plan - 03/13/16 0923    Clinical Impression Statement Pt making progess in PIP extention , and flexion - but MC 4th still 80 - pain this date only with end range flexion at 5th PIP and 4th MC flexion - pt to hold off on putty still    Rehab Potential Good   OT Frequency 1x / week   OT Duration 4 weeks   OT Treatment/Interventions Self-care/ADL training;Fluidtherapy;Parrafin;Patient/family education;Splinting;Manual  Therapy;Passive range of motion   Plan cont to montior extention of PIP , flexion of 4th MC and 5th PIP - pain ?   OT Home Exercise Plan See pt instruction    Consulted and Agree with Plan of Care Patient      Patient will benefit from skilled therapeutic intervention in order to improve the following deficits and impairments:  Decreased coordination, Decreased range of motion, Impaired flexibility, Pain, Impaired UE functional use, Decreased strength  Visit Diagnosis: Pain in left hand  Stiffness of left hand, not elsewhere classified  Muscle weakness (generalized)    Problem List There are no active problems to display for this patient.   [name], Terald Jump OTR/L,CLT  03/13/2016, 9:26 AM  [facility] [address] [place], [place], [address] Phone: [phone]   Fax:  [phone]  Name: Karen PomfretSherea D Harnisch MRN: [id number] Date of Birth: 12/14/1985

## 2016-03-13 NOTE — Patient Instructions (Signed)
Same HEP but focus on 4th MC flexion  And PIP composite flexion 5th

## 2016-03-19 ENCOUNTER — Ambulatory Visit: Payer: Worker's Compensation | Attending: Student | Admitting: Occupational Therapy

## 2016-03-19 DIAGNOSIS — M25642 Stiffness of left hand, not elsewhere classified: Secondary | ICD-10-CM | POA: Diagnosis present

## 2016-03-19 DIAGNOSIS — M6281 Muscle weakness (generalized): Secondary | ICD-10-CM | POA: Insufficient documentation

## 2016-03-19 DIAGNOSIS — M79642 Pain in left hand: Secondary | ICD-10-CM | POA: Insufficient documentation

## 2016-03-19 NOTE — Therapy (Signed)
[place] [facility] [address] [place], [place], [address] Phone: [phone]   Fax:  [phone]  Occupational Therapy Treatment  Patient Details  Name: Karen Schroeder: [id number] Date of Schroeder: 03/05/1986 Referring Provider: Marney DoctorMcGhee  Encounter Date: 03/19/2016      OT End of Session - 03/19/16 1517    Visit Number 7   Number of Visits 10   Date for OT Re-Evaluation 04/07/16   OT Start Time 1357   OT Stop Time 1430   OT Time Calculation (min) 33 min   Activity Tolerance Patient tolerated treatment well   Behavior During Therapy [facility] for tasks assessed/performed      Past Medical History  Diagnosis Date  . Allergy     Past Surgical History  Procedure Laterality Date  . Cholecystectomy      There were no vitals filed for this visit.      Subjective Assessment - 03/19/16 1413    Subjective  Pain better -and doing exercises - think my range better and using spring splint - but felt pull at the tip of the ring finger - so held off on that    Patient Stated Goals Want to get my motion better - make fist, fix my hair, pick up ojbects, cleaning like scrubbing,  tying shoes, open jar   Currently in Pain? No/denies            [facility] Assessment - 03/19/16 0001    Strength   Right Hand Grip (lbs) 50   Left Hand Grip (lbs) 40   Left Hand AROM   L Ring  MCP 0-90 86 Degrees   L Ring PIP 0-100 100 Degrees  -10   L Ring DIP 0-70 60 Degrees   L Little  MCP 0-90 100 Degrees   L Little PIP 0-100 90 Degrees                  OT Treatments/Exercises (OP) - 03/19/16 0001    LUE Fluidotherapy   Number Minutes Fluidotherapy 10 Minutes   LUE Fluidotherapy Location Hand   [facility] to [facility] ROM and  decrease pain       [facility] for [facility] and grip  See flowsheet  AROM for 4th DIP flexion  AROM  Intrinsic fist - blocked with and without  pencil to increase PIP flexion -  blocked MC at 90 degrees  Need t/c for pulling 4th MC down - got 85  Pt to add gentle PROM for 4th MC flexion t0 90 degrees PIP 100 AROM  Tendon glides - full fist   Gentle stretch for PIP extention 4th     Stabilize proximal phalanges during full extention and lumbrical fist to increase PIP extention - and prevent over stretch of volar plate    Pt  showed  PIP extention -[facility] - but able to keep it extended during lumbrical fist after blocked and tendon glides and PROM   Keep pain 1/10             OT Education - 03/19/16 1513    Education provided Yes   Education Details HEP   Person(s) Educated Patient   Methods Explanation;Demonstration;Tactile cues;Verbal cues   Comprehension Verbal cues required;Returned demonstration;Verbalized understanding          OT Short Term Goals - 03/10/16 1041    OT SHORT TERM GOAL #1   Title Pain on PRWHE improve  with 10 points to use hand    Baseline Pain at eval 13/50 and now 9/50 - still with pulling or lifting objects    Time 2   Period Weeks   Status On-going   OT SHORT TERM GOAL #2   Title AROM in L 4th and 5th digits improve to [facility] for flexion and extention    Baseline Extention of 4th PIP limited , flexion of 4th PIP and MC - and 5th PIP - see flowsheet   Time 3   Period Weeks   Status On-going           OT Long Term Goals - 03/10/16 1042    OT LONG TERM GOAL #1   Title L grip strength improve with 5-10 lbs to  carry 8 lbs objects without pain    Baseline Grip improved to 31 -R 50 - need to assess next session    Time 4   Period Weeks   Status On-going   OT LONG TERM GOAL #2   Title Function on PRWHE improve with 10-15 points    Baseline Function score 4/50 now   Status Achieved               Plan - 03/19/16 1518    Clinical Impression Statement Pt showed increase grip strength in L and showed increase AROM - pt making progress in ROM and pain - pt to cont with same HEP - no PROM to  DIP/PIP  flexion 4th    Rehab Potential Good   OT Frequency 1x / week   OT Duration 4 weeks   OT Treatment/Interventions Self-care/ADL training;Fluidtherapy;Parrafin;Patient/family education;Splinting;Manual Therapy;Passive range of motion   Plan cont monitor progress and update HEP as needed   OT Home Exercise Plan See pt instruction    Consulted and Agree with Plan of Care Patient      Patient will benefit from skilled therapeutic intervention in order to improve the following deficits and impairments:  Decreased coordination, Decreased range of motion, Impaired flexibility, Pain, Impaired UE functional use, Decreased strength  Visit Diagnosis: Pain in left hand  Stiffness of left hand, not elsewhere classified  Muscle weakness (generalized)    Problem List There are no active problems to display for this patient.   Kalyna Paolella OTR/L ,CLT 03/19/2016, 3:26 PM  [place] [facility] [address] , [place], [address] Phone: [phone]   Fax:  [phone]  Name: Karen Schroeder: [id number] Date of Schroeder: 08/11/1986

## 2016-03-19 NOTE — Patient Instructions (Addendum)
  SAME HEP - keep pain down  And do extention and flexion separate during HEP  And work on PROM on 5th for DIP/PIP  Flexion - but not 4th - only AROM

## 2016-03-27 ENCOUNTER — Ambulatory Visit: Payer: Worker's Compensation | Attending: Student | Admitting: Occupational Therapy

## 2016-03-27 DIAGNOSIS — M25642 Stiffness of left hand, not elsewhere classified: Secondary | ICD-10-CM | POA: Diagnosis present

## 2016-03-27 DIAGNOSIS — M6281 Muscle weakness (generalized): Secondary | ICD-10-CM | POA: Diagnosis present

## 2016-03-27 DIAGNOSIS — M79642 Pain in left hand: Secondary | ICD-10-CM | POA: Insufficient documentation

## 2016-03-27 NOTE — Patient Instructions (Addendum)
add  PROM for DIP/PIP flexion - 3 x 30 sec

## 2016-03-27 NOTE — Therapy (Signed)
[place] [facility] [address] [place], [place], [address] Phone: [phone]   Fax:  [phone]  Occupational Therapy Treatment  Patient Details  Name: Karen Schroeder MRN: [id number] Date of Birth: 06/17/1986 Referring Provider: Marney DoctorMcGhee  Encounter Date: 03/27/2016      OT End of Session - 03/27/16 1031    Visit Number 8   Number of Visits 10   Date for OT Re-Evaluation 04/07/16   OT Start Time 1022   OT Stop Time 1055   OT Time Calculation (min) 33 min   Activity Tolerance Patient tolerated treatment well   Behavior During Therapy [facility] for tasks assessed/performed      Past Medical History  Diagnosis Date  . Allergy     Past Surgical History  Procedure Laterality Date  . Cholecystectomy      There were no vitals filed for this visit.      Subjective Assessment - 03/27/16 1022    Subjective  No pain , not able to do claw - pinkie better but not ring  - some discomfort by end of day - had to drive yesterday a lot  - typing easier    Patient Stated Goals Want to get my motion better - make fist, fix my hair, pick up ojbects, cleaning like scrubbing,  tying shoes, open jar   Currently in Pain? No/denies            [facility] Assessment - 03/27/16 0001    Left Hand AROM   L Ring  MCP 0-90 86 Degrees   L Ring PIP 0-100 100 Degrees  -10       [facility] See flowsheet  PROM for 4th DIP flexion  PROM for DIP/PIP flexion - 3 x 30 sec AROM Intrinsic fist - blocked   Need t/c for pulling 4th MC down - got 88  Pt to add gentle PROM for 4th MC flexion t0 90 degrees COmposite fist - pulling down MC into  Flexion to 90   Gentle stretch for PIP extention 4th  AROM PIP extention on table   Stabilize proximal phalanges during full extention and lumbrical fist to increase PIP extention - and prevent over stretch of volar plate Do show decrease hyper extention of MC at 4th      Pt showed [facility] -[facility] - but able to keep it extended during lumbrical fist after blocked and tendon glides and PROM   Keep pain 1/10                            OT Education - 03/27/16 1031    Education provided Yes   Education Details HEP   Person(s) Educated Patient   Methods Explanation;Demonstration;Tactile cues;Verbal cues   Comprehension Verbal cues required;Returned demonstration;Verbalized understanding          OT Short Term Goals - 03/10/16 1041    OT SHORT TERM GOAL #1   Title Pain on PRWHE improve with 10 points to use hand    Baseline Pain at eval 13/50 and now 9/50 - still with pulling or lifting objects    Time 2   Period Weeks   Status On-going   OT SHORT TERM GOAL #2   Title AROM in L 4th and 5th digits improve to [facility] for flexion and extention    Baseline Extention of 4th PIP limited , flexion of  4th PIP and MC - and 5th PIP - see flowsheet   Time 3   Period Weeks   Status On-going           OT Long Term Goals - 03/10/16 1042    OT LONG TERM GOAL #1   Title L grip strength improve with 5-10 lbs to  carry 8 lbs objects without pain    Baseline Grip improved to 31 -R 50 - need to assess next session    Time 4   Period Weeks   Status On-going   OT LONG TERM GOAL #2   Title Function on PRWHE improve with 10-15 points    Baseline Function score 4/50 now   Status Achieved               Plan - 03/27/16 1032    Clinical Impression Statement Pt show increase functional use and decrease pain at 4th and 5th - extention at 4th PIP still -10 even during session when pushing flexion of DIP/PIP  intrinsic fist - and full fist into palm - pt to add DIP/PIP flexion stretch to HEP    Rehab Potential Good   OT Frequency 1x / week   OT Duration 4 weeks   OT Treatment/Interventions Self-care/ADL training;Fluidtherapy;Parrafin;Patient/family education;Splinting;Manual Therapy;Passive range of motion   OT Home  Exercise Plan See pt instruction    Consulted and Agree with Plan of Care Patient      Patient will benefit from skilled therapeutic intervention in order to improve the following deficits and impairments:  Decreased coordination, Decreased range of motion, Impaired flexibility, Pain, Impaired UE functional use, Decreased strength  Visit Diagnosis: Pain in left hand  Stiffness of left hand, not elsewhere classified  Muscle weakness (generalized)    Problem List There are no active problems to display for this patient.   Oletta Cohn OTR/L,CLT  03/27/2016, 11:04 AM  [place] [facility] PHYSICAL AND SPORTS MEDICINE [address] [place], [address] Phone: [phone]   Fax:  [phone]  Name: Karen Schroeder MRN: [id number] Date of Birth: 1986/08/08

## 2016-04-03 ENCOUNTER — Ambulatory Visit: Payer: Worker's Compensation | Admitting: Occupational Therapy

## 2016-04-03 DIAGNOSIS — M6281 Muscle weakness (generalized): Secondary | ICD-10-CM

## 2016-04-03 DIAGNOSIS — M79642 Pain in left hand: Secondary | ICD-10-CM | POA: Diagnosis not present

## 2016-04-03 DIAGNOSIS — M25642 Stiffness of left hand, not elsewhere classified: Secondary | ICD-10-CM

## 2016-04-03 NOTE — Therapy (Signed)
[place] [facility]TS MEDICINE [address] [place], [address] Phone: [phone]   Fax:  [phone]  Occupational Therapy Treatment  Patient Details  Name: Karen Schroeder MRN: [id number] Date of Birth: September 07, 1986 Referring Provider: Marney Doctor  Encounter Date: 04/03/2016      OT End of Session - 04/03/16 1935    Visit Number 9   Number of Visits 10   Date for OT Re-Evaluation 04/07/16   OT Start Time 1505   OT Stop Time 1539   OT Time Calculation (min) 34 min   Activity Tolerance Patient tolerated treatment well   Behavior During Therapy [facility] for tasks assessed/performed      Past Medical History  Diagnosis Date  . Allergy     Past Surgical History  Procedure Laterality Date  . Cholecystectomy      There were no vitals filed for this visit.      Subjective Assessment - 04/03/16 1934    Subjective  Did not had any pain until yesterday - but I was using it a lot for typing - did heat for aobut 3 hrs lat night - in am good - no pain yet today    Patient Stated Goals Want to get my motion better - make fist, fix my hair, pick up ojbects, cleaning like scrubbing,  tying shoes, open jar   Currently in Pain? No/denies            [facility] OT Assessment - 04/03/16 0001    Strength   Right Hand Grip (lbs) 69   Left Hand Grip (lbs) 58   Left Hand AROM   L Ring  MCP 0-90 86 Degrees   L Ring PIP 0-100 100 Degrees  -6   L Ring DIP 0-70 66 Degrees       Measurement taken at [facility] for ROM and grip strength  See flowsheet  [place] tools for  Soft tissue on volar 4th and 5th - at [facility] to increase PIP extention  nr 4 and 2 - doing brushing and sweeping   Gentle stretch for PIP extention 4th  AROM PIP extention on table   Stabilize proximal phalanges during full extention and lumbrical fist to increase PIP extention - and prevent over stretch of volar plate Do show decrease hyper extention of MC at 4th   PROM  for 4th DIP flexion  PROM for DIP/PIP flexion - 3 x 30 sec AROM Intrinsic fist - blocked  Need t/c for pulling 4th MC down  Pt to add gentle PROM for 4th MC flexion t0 90 degrees COmposite fist - pulling down MC into Flexion to 90       Pt to cont with HEP for 2 wks - return to MD on the 10 th AUg                    OT Education - 04/03/16 1935    Education provided Yes   Education Details findings of  grip and prehension - and ROM    Person(s) Educated Patient   Methods Explanation;Demonstration;Tactile cues;Verbal cues   Comprehension Verbal cues required;Returned demonstration;Verbalized understanding          OT Short Term Goals - 03/10/16 1041    OT SHORT TERM GOAL #1   Title Pain on PRWHE improve with 10 points to use hand    Baseline Pain at eval 13/50 and now 9/50 - still with pulling or lifting objects  Time 2   Period Weeks   Status On-going   OT SHORT TERM GOAL #2   Title AROM in L 4th and 5th digits improve to [facility] for flexion and extention    Baseline Extention of 4th PIP limited , flexion of 4th PIP and MC - and 5th PIP - see flowsheet   Time 3   Period Weeks   Status On-going           OT Long Term Goals - 03/10/16 1042    OT LONG TERM GOAL #1   Title L grip strength improve with 5-10 lbs to  carry 8 lbs objects without pain    Baseline Grip improved to 31 -R 50 - need to assess next session    Time 4   Period Weeks   Status On-going   OT LONG TERM GOAL #2   Title Function on PRWHE improve with 10-15 points    Baseline Function score 4/50 now   Status Achieved               Plan - 04/03/16 1936    Clinical Impression Statement Pt cont to make progress in AROM at 4th digits flexoin and extention - this date grip and prehension strength assess - and showed great progrss with only decreasing pain and increasing ROM - cont to hold off on putty and cont wtih work on last 5-10 degrees of  AROm    Rehab Potential Good    OT Frequency Biweekly   OT Duration 2 weeks   OT Treatment/Interventions Self-care/ADL training;Fluidtherapy;Parrafin;Patient/family education;Splinting;Manual Therapy;Passive range of motion   Plan cont wth same HEP for 2 wks - reassess for MD viisit   OT Home Exercise Plan See pt instruction    Consulted and Agree with Plan of Care Patient      Patient will benefit from skilled therapeutic intervention in order to improve the following deficits and impairments:  Decreased coordination, Decreased range of motion, Impaired flexibility, Pain, Impaired UE functional use, Decreased strength  Visit Diagnosis: Pain in left hand  Stiffness of left hand, not elsewhere classified  Muscle weakness (generalized)    Problem List There are no active problems to display for this patient.   [name], Melesio Madara OTR/L,CLT 04/03/2016, 7:39 PM  [place] [facility] [address] [place], [place], [address] Phone: [phone]   Fax:  [phone]  Name: Emmit PomfretSherea D Faron MRN: [id number] Date of Birth: 01/10/1986

## 2016-04-03 NOTE — Patient Instructions (Signed)
Same HEP as last time  

## 2016-04-17 ENCOUNTER — Ambulatory Visit: Payer: Worker's Compensation | Attending: Student | Admitting: Occupational Therapy

## 2016-04-17 DIAGNOSIS — M25642 Stiffness of left hand, not elsewhere classified: Secondary | ICD-10-CM | POA: Insufficient documentation

## 2016-04-17 DIAGNOSIS — M79642 Pain in left hand: Secondary | ICD-10-CM | POA: Insufficient documentation

## 2016-04-17 DIAGNOSIS — M6281 Muscle weakness (generalized): Secondary | ICD-10-CM | POA: Diagnosis present

## 2016-04-17 NOTE — Patient Instructions (Signed)
Cont work on 4th MC flexion  PIP extention - LMB splint for last 6 degrees - 2 x day for 3-5 min  Then rubber band for PIP extention 8-10 degrees - but block MC to prevent hyper extention

## 2016-04-17 NOTE — Therapy (Signed)
[facility] [address] [place], [address] Phone: [phone]   Fax:  [phone]  Occupational Therapy Treatment  Patient Details  Name: Karen Schroeder MRN: [id number] Date of Birth: 1986-05-21 Referring Provider: Mikle Bosworth  Encounter Date: 04/17/2016      OT End of Session - 04/17/16 1557    Visit Number 10   Number of Visits 10   Date for OT Re-Evaluation 04/17/16   OT Start Time 1515   OT Stop Time 1544   OT Time Calculation (min) 29 min   Activity Tolerance Patient tolerated treatment well   Behavior During Therapy [facility] for tasks assessed/performed      Past Medical History:  Diagnosis Date  . Allergy     Past Surgical History:  Procedure Laterality Date  . CHOLECYSTECTOMY      There were no vitals filed for this visit.      Subjective Assessment - 04/17/16 1552    Subjective  I am doing okay - did not really had any pain - using it in everything - can crunch my hair, work is good -could open jar last night   Patient Stated Goals Want to get my motion better - make fist, fix my hair, pick up ojbects, cleaning like scrubbing,  tying shoes, open jar   Currently in Pain? No/denies            [facility] OT Assessment - 04/17/16 0001      Strength   Right Hand Grip (lbs) 69   Left Hand Grip (lbs) 58     Left Hand AROM   L Ring  MCP 0-90 85 Degrees   L Ring PIP 0-100 100 Degrees  -6   L Ring DIP 0-70 70 Degrees     measurements taken - see flow sheet  PRWHE done - score 4/50 for pain ,  0/50 for function  Pt has long nails and limiting her composite fist - at [facility]   Cont work on 4th MC flexion  Last 5 degrees  PIP extention - LMB splint for last 6 degrees - pt wore in clinic 5 min  Then  rubber band for PIP extention 8-10 reps  - but block MC to prevent hyper extention  Did good and no pain with review of HEP                      OT Education - 04/17/16 1557    Education provided Yes   Education Details HEP and discharge instruction   Person(s) Educated Patient   Methods Explanation;Demonstration;Tactile cues;Verbal cues   Comprehension Verbal cues required;Returned demonstration;Verbalized understanding          OT Short Term Goals - 04/17/16 1559      OT SHORT TERM GOAL #1   Title Pain on PRWHE improve with 10 points to use hand    Baseline pain improve from 13 to 4/50   Status Achieved     OT SHORT TERM GOAL #2   Title AROM in L 4th and 5th digits improve to [facility] for flexion and extention    Baseline PIP extention -6 and flexion MC 85 4th - PIP adn DIP flexion WNL    Status Achieved           OT Long Term Goals - 04/17/16 1600      OT LONG TERM GOAL #1   Title L grip strength improve with 5-10 lbs to  carry 8 lbs objects without pain    Baseline Grip 55 L , R 60's    Status Achieved     OT LONG TERM GOAL #2   Title Function on PRWHE improve with 10-15 points    Baseline Function improve to 0/50   Status Achieved               Plan - 04/17/16 1558    Clinical Impression Statement Pt made great progress in flexion and extention at 4th digit on L hand - grip strength and pain - met all goals - pt to cont to work on the last 6 degrees of PIP extention and flexion 4th MC - but  with no pain - pt discharge with HEP and pt to see MD  next week    OT Treatment/Interventions Self-care/ADL training;Fluidtherapy;Parrafin;Patient/family education;Splinting;Manual Therapy;Passive range of motion   Plan discharge with HEP    OT Home Exercise Plan See pt instruction    Consulted and Agree with Plan of Care Patient      Patient will benefit from skilled therapeutic intervention in order to improve the following deficits and impairments:  Decreased coordination, Decreased range of motion, Impaired flexibility, Pain, Impaired UE functional use, Decreased strength  Visit Diagnosis: Pain in left hand  Stiffness of left hand, not  elsewhere classified  Muscle weakness (generalized)    Problem List There are no active problems to display for this patient.   Rosalyn Gess OTR/L,CLT  04/17/2016, 4:03 PM  [facility] [address] [place], [address] Phone: [phone]   Fax:  [phone]  Name: Karen Schroeder MRN: [id number] Date of Birth: 10-14-1985

## 2016-12-29 ENCOUNTER — Encounter: Payer: Self-pay | Admitting: *Deleted

## 2016-12-29 ENCOUNTER — Ambulatory Visit
Admission: EM | Admit: 2016-12-29 | Discharge: 2016-12-29 | Disposition: A | Discharge status: Home / Self Care | Payer: Managed Care, Other (non HMO) | Attending: Family Medicine | Admitting: Family Medicine

## 2016-12-29 DIAGNOSIS — J0100 Acute maxillary sinusitis, unspecified: Secondary | ICD-10-CM | POA: Diagnosis not present

## 2016-12-29 MED ORDER — AMOXICILLIN 875 MG PO TABS: 875.0000 mg | ORAL_TABLET | Freq: Two times a day (BID) | ORAL | 0 refills | Status: DC

## 2016-12-29 NOTE — ED Provider Notes (Signed)
[facility]    CSN: [id number] Arrival date & time: 12/29/16  1539     History   Chief Complaint Chief Complaint  Patient presents with  . Otalgia  . Facial Pain  . Dizziness    HPI Karen Schroeder is a 31 y.o. female.   The history is provided by the patient.  URI  Presenting symptoms: congestion, cough, facial pain, fatigue and fever   Severity:  Moderate Onset quality:  Sudden Duration:  1 week Timing:  Constant Progression:  Worsening Chronicity:  New Relieved by:  Nothing Ineffective treatments:  OTC medications Associated symptoms: headaches and sinus pain   Associated symptoms: no wheezing   Risk factors: not elderly, no chronic cardiac disease, no chronic kidney disease, no chronic respiratory disease, no diabetes mellitus, no immunosuppression, no recent illness, no recent travel and no sick contacts     Past Medical History:  Diagnosis Date  . Allergy     There are no active problems to display for this patient.   Past Surgical History:  Procedure Laterality Date  . CHOLECYSTECTOMY      OB History    No data available       Home Medications    Prior to Admission medications   Medication Sig Start Date End Date Taking? Authorizing Provider  cetirizine-pseudoephedrine (ZYRTEC-D) 5-120 MG tablet Take 1 tablet by mouth 2 (two) times daily.   Yes Historical Provider, MD  Cholecalciferol (VITAMIN D-3 PO) Take by mouth.   Yes Historical Provider, MD  Multiple Vitamin (MULTIVITAMIN) capsule Take 1 capsule by mouth daily.   Yes Historical Provider, MD  albuterol (PROVENTIL HFA;VENTOLIN HFA) 108 (90 BASE) MCG/ACT inhaler Inhale 2 puffs into the lungs every 4 (four) hours as needed for wheezing or shortness of breath.    Historical Provider, MD  amoxicillin (AMOXIL) 875 MG tablet Take 1 tablet (875 mg total) by mouth 2 (two) times daily. 12/29/16   Karen Mccallum, MD  Cyanocobalamin (B-12) 100 MCG TABS Take by mouth.    Historical Provider,  MD  fexofenadine (ALLEGRA) 180 MG tablet Take 180 mg by mouth daily.    Historical Provider, MD  montelukast (SINGULAIR) 10 MG tablet Take 10 mg by mouth at bedtime.    Historical Provider, MD  moxifloxacin (VIGAMOX) 0.5 % ophthalmic solution Place 1 drop into both eyes 3 (three) times daily. For seven days 03/16/15   Rae Halsted, PA-C  ranitidine (ZANTAC) 150 MG capsule Take 150 mg by mouth 2 (two) times daily.    Historical Provider, MD  triamcinolone (NASACORT AQ) 55 MCG/ACT AERO nasal inhaler Place 2 sprays into the nose daily.    Historical Provider, MD    Family History History reviewed. No pertinent family history.  Social History Social History  Substance Use Topics  . Smoking status: Former Games developer  . Smokeless tobacco: Never Used  . Alcohol use Yes     Comment: occassion     Allergies   Patient has no known allergies.   Review of Systems Review of Systems  Constitutional: Positive for fatigue and fever.  HENT: Positive for congestion and sinus pain.   Respiratory: Positive for cough. Negative for wheezing.   Neurological: Positive for headaches.     Physical Exam Triage Vital Signs ED Triage Vitals  Enc Vitals Group     BP 12/29/16 1613 (!) 150/104     Pulse Rate 12/29/16 1613 81     Resp 12/29/16 1613 16     Temp  12/29/16 1613 98 F (36.7 C)     Temp Source 12/29/16 1613 Oral     SpO2 12/29/16 1613 100 %     Weight 12/29/16 1615 (!) 340 lb (154.2 kg)     Height 12/29/16 1615  (1.727 m)     Head Circumference --      Peak Flow --      Pain Score --      Pain Loc --      Pain Edu? --      Excl. in GC? --    No data found.   Updated Vital Signs BP (!) 150/104 (BP Location: Left Arm)   Pulse 81   Temp 98 F (36.7 C) (Oral)   Resp 16   Ht  (1.727 m)   Wt (!) 340 lb (154.2 kg)   LMP 12/26/2016 (Exact Date)   SpO2 100%   BMI 51.70 kg/m   Visual Acuity Right Eye Distance:   Left Eye Distance:   Bilateral Distance:    Right Eye  Near:   Left Eye Near:    Bilateral Near:     Physical Exam  Constitutional: She appears well-developed and well-nourished. No distress.  HENT:  Head: Normocephalic and atraumatic.  Right Ear: Tympanic membrane, external ear and ear canal normal.  Left Ear: Tympanic membrane, external ear and ear canal normal.  Nose: Mucosal edema and rhinorrhea present. No nose lacerations, sinus tenderness, nasal deformity, septal deviation or nasal septal hematoma. No epistaxis.  No foreign bodies. Right sinus exhibits maxillary sinus tenderness and frontal sinus tenderness. Left sinus exhibits maxillary sinus tenderness and frontal sinus tenderness.  Mouth/Throat: Uvula is midline, oropharynx is clear and moist and mucous membranes are normal. No oropharyngeal exudate.  Eyes: Conjunctivae and EOM are normal. Pupils are equal, round, and reactive to light. Right eye exhibits no discharge. Left eye exhibits no discharge. No scleral icterus.  Neck: Normal range of motion. Neck supple. No thyromegaly present.  Cardiovascular: Normal rate, regular rhythm and normal heart sounds.   Pulmonary/Chest: Effort normal and breath sounds normal. No respiratory distress. She has no wheezes. She has no rales.  Lymphadenopathy:    She has no cervical adenopathy.  Skin: She is not diaphoretic.  Nursing note and vitals reviewed.    UC Treatments / Results  Labs (all labs ordered are listed, but only abnormal results are displayed) Labs Reviewed - No data to display  EKG  EKG Interpretation None       Radiology No results found.  Procedures Procedures (including critical care time)  Medications Ordered in UC Medications - No data to display   Initial Impression / Assessment and Plan / UC Course  I have reviewed the triage vital signs and the nursing notes.  Pertinent labs & imaging results that were available during my care of the patient were reviewed by me and considered in my medical decision  making (see chart for details).       Final Clinical Impressions(s) / UC Diagnoses   Final diagnoses:  Acute maxillary sinusitis, recurrence not specified    New Prescriptions Discharge Medication List as of 12/29/2016  4:29 PM    START taking these medications   Details  amoxicillin (AMOXIL) 875 MG tablet Take 1 tablet (875 mg total) by mouth 2 (two) times daily., Starting Mon 12/29/2016, Normal       1. diagnosis reviewed with patient 2. rx as per orders above; reviewed possible side effects, interactions, risks and benefits  3. Recommend supportive treatment with otc flonase 4. Follow-up prn if symptoms worsen or don't improve   Karen Mccallum, MD 12/29/16 1843

## 2016-12-29 NOTE — ED Triage Notes (Signed)
Right ear fullness, muted, and pain, also facial pain, and dizziness x1 week. OTC meds not working.

## 2017-08-22 ENCOUNTER — Other Ambulatory Visit: Payer: Self-pay

## 2017-08-22 ENCOUNTER — Ambulatory Visit
Admission: EM | Admit: 2017-08-22 | Discharge: 2017-08-22 | Disposition: A | Discharge status: Home / Self Care | Payer: Managed Care, Other (non HMO)

## 2017-08-22 ENCOUNTER — Encounter: Payer: Self-pay | Admitting: Gynecology

## 2017-08-22 DIAGNOSIS — H9202 Otalgia, left ear: Secondary | ICD-10-CM

## 2017-08-22 DIAGNOSIS — H66002 Acute suppurative otitis media without spontaneous rupture of ear drum, left ear: Secondary | ICD-10-CM

## 2017-08-22 LAB — RAPID STREP SCREEN (MED CTR MEBANE ONLY): Streptococcus, Group A Screen (Direct): NEGATIVE

## 2017-08-22 MED ORDER — AMOXICILLIN-POT CLAVULANATE 875-125 MG PO TABS: 1.0000 | ORAL_TABLET | Freq: Two times a day (BID) | ORAL | 0 refills | Status: AC

## 2017-08-22 NOTE — Discharge Instructions (Signed)
Rest,push fluids, take meds as directed. Follow up with PCP for general medical issues. Avoid Ibuprofen,Aspirin and aleve with IBS, may take tylenol for discomfort fever, use probiotics while taking abx. Return to UC as needed. Got to Er for worsening issues.

## 2017-08-22 NOTE — ED Provider Notes (Signed)
[facility]    CSN: [id number] Arrival date & time: 08/22/17  0830     History   Chief Complaint Chief Complaint  Patient presents with  . Otalgia  . Sore Throat    HPI Karen Schroeder is a 31 y.o. female.   31 yr old morbidly obese black female presents to UC with cc of sudden onset excruciating left ear pain and sore throat x 1 day, sick contact exposure, works for [employer] "lots of sick kids". Tried OTC meds without relief.     The history is provided by the patient. No language interpreter was used.  Otalgia  Location:  Left Behind ear:  No abnormality Quality:  Aching and pressure Severity:  Moderate Onset quality:  Sudden Timing:  Constant Chronicity:  New Context: recent URI   Relieved by:  Nothing Associated symptoms: congestion and sore throat   Associated symptoms: no cough, no fever, no headaches, no rash, no rhinorrhea and no vomiting   Sore Throat  Pertinent negatives include no headaches.    Past Medical History:  Diagnosis Date  . Allergy     There are no active problems to display for this patient.   Past Surgical History:  Procedure Laterality Date  . CHOLECYSTECTOMY      OB History    No data available       Home Medications    Prior to Admission medications   Medication Sig Start Date End Date Taking? Authorizing Provider  albuterol (PROVENTIL HFA;VENTOLIN HFA) 108 (90 BASE) MCG/ACT inhaler Inhale 2 puffs into the lungs every 4 (four) hours as needed for wheezing or shortness of breath.   Yes [provider]  cetirizine-pseudoephedrine (ZYRTEC-D) 5-120 MG tablet Take 1 tablet by mouth 2 (two) times daily.   Yes [provider]  colestipol (COLESTID) 1 g tablet Take 1 g by mouth 2 (two) times daily.   Yes [provider]  montelukast (SINGULAIR) 10 MG tablet Take 10 mg by mouth at bedtime.   Yes [provider]  Multiple Vitamin (MULTIVITAMIN) capsule Take 1 capsule  by mouth daily.   Yes [provider]  ranitidine (ZANTAC) 150 MG capsule Take 150 mg by mouth 2 (two) times daily.   Yes [provider]  triamcinolone (NASACORT AQ) 55 MCG/ACT AERO nasal inhaler Place 2 sprays into the nose daily.   Yes [provider]  amoxicillin (AMOXIL) 875 MG tablet Take 1 tablet (875 mg total) by mouth 2 (two) times daily. 12/29/16   Payton Mccallumonty, [place], MD  amoxicillin-clavulanate (AUGMENTIN) 875-125 MG tablet Take 1 tablet by mouth every 12 (twelve) hours for 10 days. 08/22/17 09/01/17  Lalah Durango, Para MarchJeanette, NP  Cholecalciferol (VITAMIN D-3 PO) Take by mouth.    [provider]  Cyanocobalamin (B-12) 100 MCG TABS Take by mouth.    [provider]  fexofenadine (ALLEGRA) 180 MG tablet Take 180 mg by mouth daily.    [provider]  moxifloxacin (VIGAMOX) 0.5 % ophthalmic solution Place 1 drop into both eyes 3 (three) times daily. For seven days 03/16/15   Rae HalstedLee, Laurie W, PA-C    Family History No family history on file.  Social History Social History   Tobacco Use  . Smoking status: Former Games developermoker  . Smokeless tobacco: Never Used  Substance Use Topics  . Alcohol use: Yes    Comment: occassion  . Drug use: No     Allergies   Patient has no known allergies.   Review  of Systems Review of Systems  Constitutional: Negative for chills and fever.  HENT: Positive for congestion, ear pain and sore throat. Negative for rhinorrhea.   Eyes: Negative.   Respiratory: Negative for cough.   Gastrointestinal: Negative for nausea and vomiting.  Endocrine: Negative.   Genitourinary: Negative for dysuria.  Musculoskeletal: Negative for myalgias.  Skin: Negative for rash.  Allergic/Immunologic: Negative.   Neurological: Negative for headaches.  Hematological: Negative.   Psychiatric/Behavioral: Negative.   All other systems reviewed and are negative.    Physical Exam Triage Vital Signs ED Triage Vitals  Enc Vitals  Group     BP 08/22/17 0933 123/72     Pulse Rate 08/22/17 0933 70     Resp 08/22/17 0933 16     Temp 08/22/17 0933 98.1 F (36.7 C)     Temp Source 08/22/17 0933 Oral     SpO2 08/22/17 0933 100 %     Weight 08/22/17 0930 (!) 360 lb (163.3 kg)     Height 08/22/17 0930 5\' 7"  (1.702 m)     Head Circumference --      Peak Flow --      Pain Score 08/22/17 0930 3     Pain Loc --      Pain Edu? --      Excl. in GC? --    No data found.  Updated Vital Signs BP 123/72 (BP Location: Left Arm)   Pulse 70   Temp 98.1 F (36.7 C) (Oral)   Resp 16   Ht 5\' 7"  (1.702 m)   Wt (!) 360 lb (163.3 kg)   LMP 07/27/2017   SpO2 100%   BMI 56.38 kg/m   Visual Acuity Right Eye Distance:   Left Eye Distance:   Bilateral Distance:    Right Eye Near:   Left Eye Near:    Bilateral Near:     Physical Exam  Constitutional: She is oriented to person, place, and time. She appears well-developed and well-nourished. She is active and cooperative. No distress.  HENT:  Head: Normocephalic.  Right Ear: Hearing, tympanic membrane, external ear and ear canal normal.  Left Ear: Hearing, external ear and ear canal normal. Tympanic membrane is erythematous and bulging.  Nose: Mucosal edema present.  Mouth/Throat: Uvula is midline and mucous membranes are normal. Posterior oropharyngeal erythema present.  Eyes: Conjunctivae, EOM and lids are normal. Pupils are equal, round, and reactive to light.  Neck: Trachea normal and normal range of motion. No tracheal deviation present.  Cardiovascular: Regular rhythm, normal heart sounds and normal pulses.  No murmur heard. Pulmonary/Chest: Effort normal and breath sounds normal.  Abdominal: Soft. Bowel sounds are normal. There is no tenderness.  Musculoskeletal: Normal range of motion.  Lymphadenopathy:    She has no cervical adenopathy.  Neurological: She is alert and oriented to person, place, and time. GCS eye subscore is 4. GCS verbal subscore is 5. GCS  motor subscore is 6.  Skin: Skin is warm and dry. No rash noted.  Psychiatric: She has a normal mood and affect. Her speech is normal and behavior is normal.  Nursing note and vitals reviewed.    UC Treatments / Results  Labs (all labs ordered are listed, but only abnormal results are displayed) Labs Reviewed  RAPID STREP SCREEN (NOT AT [facility])  CULTURE, GROUP A STREP [facility])    EKG  EKG Interpretation None       Radiology No results found.  Procedures Procedures (including critical care time)  Medications Ordered in UC Medications - No data to display   Initial Impression / Assessment and Plan / UC Course  I have reviewed the triage vital signs and the nursing notes.  Pertinent labs & imaging results that were available during my care of the patient were reviewed by me and considered in my medical decision making (see chart for details).     Treating for left otitis media, strep test was negative in office today. Rest,push fluids, take meds as directed. Follow up with PCP for general medical issues. Avoid Ibuprofen,Aspirin and aleve with IBS, may take tylenol for discomfort fever, use probiotics while taking abx. Return to UC as needed. Got to Er for worsening issues. Pt verbalized understanding to this provider.   Final Clinical Impressions(s) / UC Diagnoses   Final diagnoses:  Acute suppurative otitis media of left ear without spontaneous rupture of tympanic membrane, recurrence not specified  Acute otalgia, left    ED Discharge Orders        Ordered    amoxicillin-clavulanate (AUGMENTIN) 875-125 MG tablet  Every 12 hours     08/22/17 1005       Controlled Substance Prescriptions    Cord Wilczynski, Para MarchJeanette, NP 08/22/17 1020

## 2017-08-22 NOTE — ED Triage Notes (Signed)
Patient c/o nasal congestion \ painful to swallow and left ear pain.

## 2017-08-25 LAB — CULTURE, GROUP A STREP (THRC)

## 2017-10-05 ENCOUNTER — Ambulatory Visit: Payer: Self-pay | Admitting: Certified Nurse Midwife

## 2017-10-26 ENCOUNTER — Ambulatory Visit: Payer: Self-pay | Admitting: Obstetrics and Gynecology

## 2017-11-17 ENCOUNTER — Ambulatory Visit (INDEPENDENT_AMBULATORY_CARE_PROVIDER_SITE_OTHER): Payer: Managed Care, Other (non HMO) | Admitting: Obstetrics and Gynecology

## 2017-11-17 ENCOUNTER — Encounter: Payer: Self-pay | Admitting: Obstetrics and Gynecology

## 2017-11-17 VITALS — BP 130/82 | HR 97 | Ht 67.5 in | Wt 362.0 lb

## 2017-11-17 DIAGNOSIS — N926 Irregular menstruation, unspecified: Secondary | ICD-10-CM | POA: Diagnosis not present

## 2017-11-17 DIAGNOSIS — Z1151 Encounter for screening for human papillomavirus (HPV): Secondary | ICD-10-CM | POA: Diagnosis not present

## 2017-11-17 DIAGNOSIS — Z30011 Encounter for initial prescription of contraceptive pills: Secondary | ICD-10-CM | POA: Diagnosis not present

## 2017-11-17 DIAGNOSIS — Z01419 Encounter for gynecological examination (general) (routine) without abnormal findings: Secondary | ICD-10-CM | POA: Diagnosis not present

## 2017-11-17 DIAGNOSIS — Z124 Encounter for screening for malignant neoplasm of cervix: Secondary | ICD-10-CM | POA: Diagnosis not present

## 2017-11-17 MED ORDER — LEVONORGESTREL-ETHINYL ESTRAD 0.1-20 MG-MCG PO TABS: 1.0000 | ORAL_TABLET | Freq: Every day | ORAL | 12 refills | Status: DC

## 2017-11-17 NOTE — Patient Instructions (Signed)
I value your feedback and entrusting us with your care. If you get a [facility] patient survey, I would appreciate you taking the time to let us know about your experience today. Thank you! 

## 2017-11-17 NOTE — Progress Notes (Addendum)
PCP:  Rayetta Humphrey, MD   Chief Complaint  Patient presents with  . Gynecologic Exam    Periods are irregular      HPI:      Ms. Karen Schroeder is a 32 y.o. G0P0000 who LMP was Patient's last menstrual period was 10/19/2017 (exact date)., presents today for her annual examination.  Her menses are Q4-6 1/2 wks (recent normal for pt), lasting 5 days.  Dysmenorrhea mild, occurring first 1-2 days of flow. She does not have intermenstrual bleeding. She would like to start [facility] for cycle control. Did OCPs in high school/college without side effects. No hx of HTN, DVTs, tobacco.  Sex activity: has sex with females.  Last Pap: 09/26/16 Results were: no abnormalities /11/16 neg HPV DNA  Hx of STDs: none  There is a FH of breast cancer in her mat cousin, uterine cancer in her mom, and pancreatic cancer in her MGM. Genetic testing not done but doesn't meet current guidelines.  There is no FH of ovarian cancer. The patient does not do self-breast exams.  Tobacco use: The patient denies current or previous tobacco use. Alcohol use: social drinker No drug use.  Exercise: moderately active  She does get adequate calcium and Vitamin D in her diet.  Labs with PCP.   Past Medical History:  Diagnosis Date  . Allergy   . IBS (irritable bowel syndrome)   . Obesity   . Vitamin D deficiency     Past Surgical History:  Procedure Laterality Date  . CHOLECYSTECTOMY      Family History  Problem Relation Age of Onset  . Uterine cancer Mother 56  . Pancreatic cancer Maternal Grandmother   . Liver cancer Maternal Grandmother   . Throat cancer Paternal Grandmother   . Prostate cancer Maternal Grandfather   . Breast cancer Cousin     Social History   Socioeconomic History  . Marital status: Single    Spouse name: Not on file  . Number of children: Not on file  . Years of education: Not on file  . Highest education level: Not on file  Social Needs  . Financial resource strain: Not on  file  . Food insecurity - worry: Not on file  . Food insecurity - inability: Not on file  . Transportation needs - medical: Not on file  . Transportation needs - non-medical: Not on file  Occupational History  . Not on file  Tobacco Use  . Smoking status: Former Games developer  . Smokeless tobacco: Never Used  Substance and Sexual Activity  . Alcohol use: Yes    Comment: occassion  . Drug use: No  . Sexual activity: Yes    Birth control/protection: None  Other Topics Concern  . Not on file  Social History Narrative  . Not on file    Current Meds  Medication Sig  . albuterol (PROVENTIL HFA;VENTOLIN HFA) 108 (90 BASE) MCG/ACT inhaler Inhale 2 puffs into the lungs every 4 (four) hours as needed for wheezing or shortness of breath.  Marland Kitchen azelastine (ASTELIN) 0.1 % nasal spray Place into the nose.  . cetirizine-pseudoephedrine (ZYRTEC-D) 5-120 MG tablet Take 1 tablet by mouth 2 (two) times daily.  . Cholecalciferol (VITAMIN D-3 PO) Take by mouth.  . colestipol (COLESTID) 1 g tablet Take 1 g by mouth 2 (two) times daily.  . Cyanocobalamin (B-12) 100 MCG TABS Take by mouth.  . hyoscyamine (LEVSIN SL) 0.125 MG SL tablet Place under the tongue.  Marland Kitchen levocetirizine (  XYZAL) 5 MG tablet Take by mouth.  . Multiple Vitamin (MULTIVITAMIN) capsule Take 1 capsule by mouth daily.  . ranitidine (ZANTAC) 150 MG capsule Take 150 mg by mouth 2 (two) times daily.  . Vitamin D, Ergocalciferol, (DRISDOL) 50000 units CAPS capsule      ROS:  Review of Systems  Constitutional: Negative for fatigue, fever and unexpected weight change.  Respiratory: Negative for cough, shortness of breath and wheezing.   Cardiovascular: Negative for chest pain, palpitations and leg swelling.  Gastrointestinal: Negative for blood in stool, constipation, diarrhea, nausea and vomiting.  Endocrine: Negative for cold intolerance, heat intolerance and polyuria.  Genitourinary: Negative for dyspareunia, dysuria, flank pain, frequency,  genital sores, hematuria, menstrual problem, pelvic pain, urgency, vaginal bleeding, vaginal discharge and vaginal pain.  Musculoskeletal: Negative for back pain, joint swelling and myalgias.  Skin: Negative for rash.  Neurological: Negative for dizziness, syncope, light-headedness, numbness and headaches.  Hematological: Negative for adenopathy.  Psychiatric/Behavioral: Negative for agitation, confusion, sleep disturbance and suicidal ideas. The patient is not nervous/anxious.      Objective: BP 130/82   Pulse 97   Ht 5' 7.5" (1.715 m)   Wt (!) 362 lb (164.2 kg)   LMP 10/19/2017 (Exact Date)   BMI 55.86 kg/m    Physical Exam  Constitutional: She is oriented to person, place, and time. She appears well-developed and well-nourished.  Genitourinary: Vagina normal and uterus normal. There is no rash or tenderness on the right labia. There is no rash or tenderness on the left labia. No erythema or tenderness in the vagina. No vaginal discharge found. Right adnexum does not display mass and does not display tenderness. Left adnexum does not display mass and does not display tenderness. Cervix does not exhibit motion tenderness or polyp. Uterus is not enlarged or tender.  Neck: Normal range of motion. No thyromegaly present.  Cardiovascular: Normal rate, regular rhythm and normal heart sounds.  No murmur heard. Pulmonary/Chest: Effort normal and breath sounds normal. Right breast exhibits no mass, no nipple discharge, no skin change and no tenderness. Left breast exhibits no mass, no nipple discharge, no skin change and no tenderness.  Abdominal: Soft. There is no tenderness. There is no guarding.  Musculoskeletal: Normal range of motion.  Neurological: She is alert and oriented to person, place, and time. No cranial nerve deficit.  Psychiatric: She has a normal mood and affect. Her behavior is normal.  Vitals reviewed.  Assessment/Plan: Encounter for annual routine gynecological  examination  Cervical cancer screening - Plan: IGP, Aptima HPV  Screening for HPV (human papillomavirus) - Plan: IGP, Aptima HPV  Encounter for initial prescription of contraceptive pills - OCP start with next menses. Rx eRxd. F/u prn.  - Plan: levonorgestrel-ethinyl estradiol (AVIANE) 0.1-20 MG-MCG tablet  Irregular menses - Try OCPs for cycle control.  - Plan: levonorgestrel-ethinyl estradiol (AVIANE) 0.1-20 MG-MCG tablet  Meds ordered this encounter  Medications  . levonorgestrel-ethinyl estradiol (AVIANE) 0.1-20 MG-MCG tablet    Sig: Take 1 tablet by mouth daily.    Dispense:  28 tablet    Refill:  12    Order Specific Question:   Supervising Provider    Answer:   Nadara MustardHARRIS, ROBERT P [[id number]]             GYN counsel use and side effects of OCP's, adequate intake of calcium and vitamin D, diet and exercise     F/U  Return in about 1 year (around 11/18/2018).  Jhonatan Lomeli B. Emmanuel Gruenhagen, PA-C 11/17/2017 1:20  PM

## 2017-11-17 NOTE — Addendum Note (Signed)
Addended by: Althea GrimmerOPLAND, ALICIA B on: 11/17/2017 01:20 PM   Modules accepted: Orders

## 2017-11-20 LAB — IGP, APTIMA HPV
HPV Aptima: NEGATIVE
PAP SMEAR COMMENT: 0

## 2017-12-28 ENCOUNTER — Encounter: Payer: Self-pay | Admitting: Obstetrics and Gynecology

## 2018-01-08 ENCOUNTER — Other Ambulatory Visit: Payer: Self-pay | Admitting: Obstetrics and Gynecology

## 2018-01-08 ENCOUNTER — Other Ambulatory Visit: Payer: Self-pay

## 2018-01-08 ENCOUNTER — Ambulatory Visit (INDEPENDENT_AMBULATORY_CARE_PROVIDER_SITE_OTHER): Payer: Managed Care, Other (non HMO)

## 2018-01-08 DIAGNOSIS — R350 Frequency of micturition: Secondary | ICD-10-CM | POA: Diagnosis not present

## 2018-01-08 LAB — POCT URINALYSIS DIPSTICK
BILIRUBIN UA: NEGATIVE
GLUCOSE UA: NEGATIVE
Ketones, UA: NEGATIVE
Nitrite, UA: NEGATIVE
Protein, UA: NEGATIVE
Spec Grav, UA: >=1.03 — AB (ref 1.010–1.025)
Urobilinogen, UA: NEGATIVE E.U./dL — AB
pH, UA: 6.5 (ref 5.0–8.0)

## 2018-01-08 NOTE — Addendum Note (Signed)
Addended by: Ova FreshwaterWILSON, Daron Stutz R on: 01/08/2018 01:34 PM   Modules accepted: Orders

## 2018-01-08 NOTE — Progress Notes (Signed)
C&S for urinary frequency. Will f/u with results.

## 2018-01-10 LAB — URINE CULTURE

## 2018-05-17 ENCOUNTER — Encounter: Payer: Self-pay | Admitting: Emergency Medicine

## 2018-05-17 ENCOUNTER — Ambulatory Visit
Admission: EM | Admit: 2018-05-17 | Discharge: 2018-05-17 | Disposition: A | Discharge status: Home / Self Care | Payer: Managed Care, Other (non HMO) | Attending: Family Medicine | Admitting: Family Medicine

## 2018-05-17 ENCOUNTER — Other Ambulatory Visit: Payer: Self-pay

## 2018-05-17 DIAGNOSIS — R05 Cough: Secondary | ICD-10-CM

## 2018-05-17 DIAGNOSIS — R0602 Shortness of breath: Secondary | ICD-10-CM

## 2018-05-17 DIAGNOSIS — R0781 Pleurodynia: Secondary | ICD-10-CM | POA: Diagnosis not present

## 2018-05-17 DIAGNOSIS — R059 Cough, unspecified: Secondary | ICD-10-CM

## 2018-05-17 MED ORDER — NAPROXEN 500 MG PO TABS: 500.0000 mg | ORAL_TABLET | Freq: Two times a day (BID) | ORAL | 0 refills | Status: DC | PRN

## 2018-05-17 MED ORDER — BENZONATATE 100 MG PO CAPS: 100.0000 mg | ORAL_CAPSULE | Freq: Three times a day (TID) | ORAL | 0 refills | Status: DC | PRN

## 2018-05-17 NOTE — ED Provider Notes (Signed)
[facility]  CSN: [id number] Arrival date & time: 05/17/18  1406  History   Chief Complaint Chief Complaint  Patient presents with  . URI   HPI   32 year old female presents with chest pain, cough, shortness of breath.  Patient reports that on Friday she developed left anterior shoulder pain.  Patient states that she has had some shortness of breath.  She states that she did not think much of it as 1 of her medications is known to cause her shortness of breath.  Patient developed cough today.  Cough is productive.  Associated shortness of breath and chest discomfort with deep breathing.  Patient reports that she is having back pain as well.  No fever.  No chills.  No known relieving factors.  No other associated symptoms.  No other complaints.  PMH, Surgical Hx, Family Hx, Social History reviewed and updated as below.  Past Medical History:  Diagnosis Date  . Allergy   . IBS (irritable bowel syndrome)   . Obesity   . Vitamin D deficiency    Past Surgical History:  Procedure Laterality Date  . CHOLECYSTECTOMY      OB History    Gravida  0   Para  0   Term  0   Preterm  0   AB  0   Living  0     SAB  0   TAB  0   Ectopic  0   Multiple  0   Live Births  0            Home Medications    Prior to Admission medications   Medication Sig Start Date End Date Taking? Authorizing Provider  albuterol (PROVENTIL HFA;VENTOLIN HFA) 108 (90 BASE) MCG/ACT inhaler Inhale 2 puffs into the lungs every 4 (four) hours as needed for wheezing or shortness of breath.   Yes [provider]  Cholecalciferol (VITAMIN D-3 PO) Take by mouth.   Yes [provider]  colestipol (COLESTID) 1 g tablet Take 1 g by mouth daily.    Yes [provider]  hyoscyamine (LEVSIN SL) 0.125 MG SL tablet Place under the tongue. 05/05/17  Yes [provider]  Multiple Vitamin (MULTIVITAMIN) capsule Take 1 capsule by mouth daily.   Yes [provider]  azelastine (ASTELIN) 0.1 % nasal spray Place into the nose. 01/27/17 01/27/18  [provider]  benzonatate (TESSALON) 100 MG capsule Take 1 capsule (100 mg total) by mouth 3 (three) times daily as needed. 05/17/18   Tommie Sams, DO  levocetirizine (XYZAL) 5 MG tablet Take by mouth. 01/27/17 01/27/18  [provider]  moxifloxacin (VIGAMOX) 0.5 % ophthalmic solution Place 1 drop into both eyes 3 (three) times daily. For seven days Patient not taking: Reported on 11/17/2017 03/16/15   Rae Halsted, PA-C  naproxen (NAPROSYN) 500 MG tablet Take 1 tablet (500 mg total) by mouth 2 (two) times daily as needed for mild pain or moderate pain. 05/17/18   Tommie Sams, DO    Family History Family History  Problem Relation Age of Onset  . Uterine cancer Mother 30  . Pancreatic cancer Maternal Grandmother   . Liver cancer Maternal Grandmother   . Throat cancer Paternal Grandmother   . Prostate cancer Maternal Grandfather   . Breast cancer Cousin   . Sudden Cardiac Death Father    Social History Social History   Tobacco Use  . Smoking status: Former Games developer  . Smokeless tobacco: Never  Used  Substance Use Topics  . Alcohol use: Yes    Comment: occassion  . Drug use: No    Allergies   Patient has no known allergies.   Review of Systems Review of Systems Per HPI  Physical Exam Triage Vital Signs ED Triage Vitals  Enc Vitals Group     BP 05/17/18 1424 123/77     Pulse Rate 05/17/18 1424 77     Resp 05/17/18 1424 16     Temp 05/17/18 1424 98.5 F (36.9 C)     Temp Source 05/17/18 1424 Oral     SpO2 05/17/18 1424 100 %     Weight 05/17/18 1420 (!) 360 lb (163.3 kg)     Height 05/17/18 1420 5\' 7"  (1.702 m)     Head Circumference --      Peak Flow --      Pain Score 05/17/18 1419 0     Pain Loc --      Pain Edu? --      Excl. in GC? --    Updated Vital Signs BP 123/77 (BP Location: Left Arm)   Pulse 77   Temp 98.5 F (36.9 C) (Oral)   Resp 16   Ht  5\' 7"  (1.702 m)   Wt (!) 163.3 kg   LMP 05/10/2018   SpO2 100%   BMI 56.38 kg/m   Visual Acuity Right Eye Distance:   Left Eye Distance:   Bilateral Distance:    Right Eye Near:   Left Eye Near:    Bilateral Near:     Physical Exam  Constitutional: She is oriented to person, place, and time.  Morbidly obese female no acute distress.  HENT:  Head: Normocephalic and atraumatic.  Mouth/Throat: Oropharynx is clear and moist.  Eyes: Conjunctivae are normal. Right eye exhibits no discharge. Left eye exhibits no discharge.  Cardiovascular: Normal rate and regular rhythm.  Pulmonary/Chest: Effort normal and breath sounds normal. She has no wheezes. She has no rales.  Neurological: She is alert and oriented to person, place, and time.  Psychiatric: She has a normal mood and affect. Her behavior is normal.  Nursing note and vitals reviewed.  UC Treatments / Results  Labs (all labs ordered are listed, but only abnormal results are displayed) Labs Reviewed - No data to display  EKG Interpretation: Normal sinus rhythm with a rate of 79.  Normal intervals.  Normal axis.  No ST or T wave changes.  Normal EKG.  Radiology No results found.  Procedures Procedures (including critical care time)  Medications Ordered in UC Medications - No data to display  Initial Impression / Assessment and Plan / UC Course  I have reviewed the triage vital signs and the nursing notes.  Pertinent labs & imaging results that were available during my care of the patient were reviewed by me and considered in my medical decision making (see chart for details).    32 year old female presents with cough and pleuritic chest pain.  EKG was on remarkable.  No evidence of cardiac source.  No hypoxia or tachycardia to suggest pulmonary embolus.  Exam is unrevealing.  Tessalon Perles and naproxen as needed.  Supportive care.  Final Clinical Impressions(s) / UC Diagnoses   Final diagnoses:  Cough  Pleuritic  chest pain     Discharge Instructions     EKG was normal.  Use the medication as needed.  Please see your PCP if you fail to improve or worsen  Take care  Dr.  [facility]     ED Prescriptions    Medication Sig Dispense Auth. Provider   naproxen (NAPROSYN) 500 MG tablet Take 1 tablet (500 mg total) by mouth 2 (two) times daily as needed for mild pain or moderate pain. 30 tablet Janayla Marik G, DO   benzonatate (TESSALON) 100 MG capsule Take 1 capsule (100 mg total) by mouth 3 (three) times daily as needed. 30 capsule Tommie Sams, DO     Controlled Substance Prescriptions [place] Controlled Substance Registry consulted? Not Applicable   Tommie Sams, DO 05/17/18 1450

## 2018-05-17 NOTE — ED Triage Notes (Signed)
Patient c/o cough and shortness of breath that started this morning. Patient stated she had been sneezing prior to the cough but thought this was allergy related.

## 2018-05-17 NOTE — Discharge Instructions (Signed)
EKG was normal.  Use the medication as needed.  Please see your PCP if you fail to improve or worsen  Take care  Dr. Adriana Simas

## 2018-06-11 ENCOUNTER — Other Ambulatory Visit: Payer: Self-pay | Admitting: Family Medicine

## 2018-06-11 ENCOUNTER — Ambulatory Visit
Admission: RE | Admit: 2018-06-11 | Discharge: 2018-06-11 | Disposition: A | Discharge status: Home / Self Care | Payer: Managed Care, Other (non HMO) | Source: Ambulatory Visit | Attending: Family Medicine | Admitting: Family Medicine

## 2018-06-11 DIAGNOSIS — R0600 Dyspnea, unspecified: Secondary | ICD-10-CM

## 2018-11-24 ENCOUNTER — Other Ambulatory Visit: Payer: Self-pay | Admitting: Obstetrics and Gynecology

## 2018-11-24 DIAGNOSIS — N926 Irregular menstruation, unspecified: Secondary | ICD-10-CM

## 2018-11-24 DIAGNOSIS — Z30011 Encounter for initial prescription of contraceptive pills: Secondary | ICD-10-CM

## 2018-12-14 ENCOUNTER — Other Ambulatory Visit: Payer: Self-pay

## 2018-12-14 DIAGNOSIS — N926 Irregular menstruation, unspecified: Secondary | ICD-10-CM

## 2018-12-14 DIAGNOSIS — Z30011 Encounter for initial prescription of contraceptive pills: Secondary | ICD-10-CM

## 2018-12-14 MED ORDER — LEVONORGESTREL-ETHINYL ESTRAD 0.1-20 MG-MCG PO TABS: 1.0000 | ORAL_TABLET | Freq: Every day | ORAL | 0 refills | Status: DC

## 2018-12-16 ENCOUNTER — Ambulatory Visit: Payer: Managed Care, Other (non HMO) | Admitting: Obstetrics and Gynecology

## 2019-01-17 ENCOUNTER — Ambulatory Visit: Payer: Managed Care, Other (non HMO) | Admitting: Obstetrics and Gynecology

## 2019-01-18 ENCOUNTER — Ambulatory Visit: Payer: Managed Care, Other (non HMO) | Admitting: Obstetrics and Gynecology

## 2019-01-24 ENCOUNTER — Other Ambulatory Visit: Payer: Self-pay | Admitting: Obstetrics and Gynecology

## 2019-01-24 DIAGNOSIS — N926 Irregular menstruation, unspecified: Secondary | ICD-10-CM

## 2019-01-24 DIAGNOSIS — Z30011 Encounter for initial prescription of contraceptive pills: Secondary | ICD-10-CM

## 2019-02-27 ENCOUNTER — Other Ambulatory Visit: Payer: Self-pay

## 2019-02-27 ENCOUNTER — Encounter: Payer: Self-pay | Admitting: Emergency Medicine

## 2019-02-27 ENCOUNTER — Ambulatory Visit
Admission: EM | Admit: 2019-02-27 | Discharge: 2019-02-27 | Disposition: A | Discharge status: Home / Self Care | Payer: Managed Care, Other (non HMO) | Attending: Family Medicine | Admitting: Family Medicine

## 2019-02-27 DIAGNOSIS — L0291 Cutaneous abscess, unspecified: Secondary | ICD-10-CM

## 2019-02-27 DIAGNOSIS — B9689 Other specified bacterial agents as the cause of diseases classified elsewhere: Secondary | ICD-10-CM

## 2019-02-27 MED ORDER — MUPIROCIN 2 % EX OINT: TOPICAL_OINTMENT | CUTANEOUS | 0 refills | Status: DC

## 2019-02-27 MED ORDER — SULFAMETHOXAZOLE-TRIMETHOPRIM 800-160 MG PO TABS: 1.0000 | ORAL_TABLET | Freq: Two times a day (BID) | ORAL | 0 refills | Status: AC

## 2019-02-27 NOTE — ED Provider Notes (Signed)
[facility] ____________________________________________  Time seen: Approximately 11:56 AM  I have reviewed the triage vital signs and the nursing notes.   HISTORY  Chief Complaint Abscess (APPOINTMENT)   HPI Karen Schroeder is a 33 y.o. female presenting for evaluation of tender swollen area to her back gradually present over the last 5 days.  States the area is tender and somewhat red.  Denies insect bites.  States that she knows she had what she suspected to be a ingrown hair to that area previously, but states it was not tender until the last few days.  Denies fevers.  No drainage.  Tender to touch.  Denies other aggravating or alleviating factors.  No history of MRSA.  Has had abscesses previously underneath her axilla from shaving.  Denies recent sickness or recent antibiotic use.  Denies chest pain, shortness of breath or fevers.  Sharyne Peach, MD: PCP Patient's last menstrual period was 02/20/2019 (exact date).  Denies pregnancy    Past Medical History:  Diagnosis Date  . Allergy   . IBS (irritable bowel syndrome)   . Obesity   . Vitamin D deficiency     There are no active problems to display for this patient.   Past Surgical History:  Procedure Laterality Date  . CHOLECYSTECTOMY       No current facility-administered medications for this encounter.   Current Outpatient Medications:  .  albuterol (PROVENTIL HFA;VENTOLIN HFA) 108 (90 BASE) MCG/ACT inhaler, Inhale 2 puffs into the lungs every 4 (four) hours as needed for wheezing or shortness of breath., Disp: , Rfl:  .  azelastine (ASTELIN) 0.1 % nasal spray, Place into the nose., Disp: , Rfl:  .  Cholecalciferol (VITAMIN D-3 PO), Take by mouth., Disp: , Rfl:  .  colestipol (COLESTID) 1 g tablet, Take 0.5 g by mouth daily. , Disp: , Rfl:  .  LESSINA-28 0.1-20 MG-MCG tablet, TAKE 1 TABLET BY MOUTH DAILY, Disp: 28 tablet, Rfl: 1 .  hyoscyamine (LEVSIN SL) 0.125 MG SL tablet, Place under the  tongue., Disp: , Rfl:  .  levocetirizine (XYZAL) 5 MG tablet, Take by mouth., Disp: , Rfl:  .  Multiple Vitamin (MULTIVITAMIN) capsule, Take 1 capsule by mouth daily., Disp: , Rfl:  .  mupirocin ointment (BACTROBAN) 2 %, Apply two times a day for 7 days., Disp: 22 g, Rfl: 0 .  sulfamethoxazole-trimethoprim (BACTRIM DS) 800-160 MG tablet, Take 1 tablet by mouth 2 (two) times daily for 10 days., Disp: 20 tablet, Rfl: 0  Allergies Patient has no known allergies.  Family History  Problem Relation Age of Onset  . Uterine cancer Mother 66  . Pancreatic cancer Maternal Grandmother   . Liver cancer Maternal Grandmother   . Throat cancer Paternal Grandmother   . Prostate cancer Maternal Grandfather   . Breast cancer Cousin   . Sudden Cardiac Death Father     Social History Social History   Tobacco Use  . Smoking status: Former Research scientist (life sciences)  . Smokeless tobacco: Never Used  Substance Use Topics  . Alcohol use: Yes    Comment: occassion  . Drug use: No    Review of Systems Constitutional: No fever ENT: No sore throat. Cardiovascular: Denies chest pain. Respiratory: Denies shortness of breath. Gastrointestinal: No abdominal pain.   Musculoskeletal: Positive for tenderness to back. Skin: Positive skin changes  ____________________________________________   PHYSICAL EXAM:  VITAL SIGNS: ED Triage Vitals  Enc Vitals Group     BP 02/27/19 1051 (!) 144/97  Pulse Rate 02/27/19 1051 94     Resp 02/27/19 1051 16     Temp 02/27/19 1051 98.2 F (36.8 C)     Temp Source 02/27/19 1051 Oral     SpO2 02/27/19 1051 100 %     Weight 02/27/19 1047 (!) 364 lb (165.1 kg)     Height 02/27/19 1047 5' 7.5" (1.715 m)     Head Circumference --      Peak Flow --      Pain Score 02/27/19 1047 3     Pain Loc --      Pain Edu? --      Excl. in GC? --     Constitutional: Alert and oriented. Well appearing and in no acute distress. ENT      Head: Normocephalic and atraumatic. Cardiovascular:  Normal rate, regular rhythm. Grossly normal heart sounds.  Good peripheral circulation. Respiratory: Normal respiratory effort without tachypnea nor retractions. Breath sounds are clear and equal bilaterally. No wheezes, rales, rhonchi. Musculoskeletal: Steady gait.  No midline cervical, thoracic or lumbar tenderness to palpation. Neurologic:  Normal speech and language. Speech is normal. No gait instability.  Skin:  Skin is warm, dry.  Except: Left parathoracic mid back 3 x 3 cm area of erythematous indurated skin, no fluctuance, no pointing, no drainage, tenderness at palpation, no midline tenderness. Psychiatric: Mood and affect are normal. Speech and behavior are normal. Patient exhibits appropriate insight and judgment   ___________________________________________   LABS (all labs ordered are listed, but only abnormal results are displayed)  Labs Reviewed - No data to display ____________________________________________  PROCEDURES Procedures    INITIAL IMPRESSION / ASSESSMENT AND PLAN / ED COURSE  Pertinent labs & imaging results that were available during my care of the patient were reviewed by me and considered in my medical decision making (see chart for details).  Well-appearing patient.  No acute distress.  Left back abscess, no clear indication for I&D at this time, patient agrees.  Will treat with oral Bactrim and topical Bactroban.  Warm compresses, supportive care.  Follow-up with primary care surgery as needed.Discussed indication, risks and benefits of medications with patient.  Discussed follow up with Primary care physician this week. Discussed follow up and return parameters including no resolution or any worsening concerns. Patient verbalized understanding and agreed to plan.   ____________________________________________   FINAL CLINICAL IMPRESSION(S) / ED DIAGNOSES  Final diagnoses:  Abscess     ED Discharge Orders         Ordered     sulfamethoxazole-trimethoprim (BACTRIM DS) 800-160 MG tablet  2 times daily     02/27/19 1131    mupirocin ointment (BACTROBAN) 2 %     02/27/19 1131           Note: This dictation was prepared with Dragon dictation along with smaller phrase technology. Any transcriptional errors that result from this process are unintentional.         Renford DillsMiller, Lonnie Rosado, NP 02/27/19 1159

## 2019-02-27 NOTE — ED Triage Notes (Signed)
Patient c/o abscess and swelling and pain in her upper back that started on Wed.

## 2019-02-27 NOTE — Discharge Instructions (Addendum)
Take medication as prescribed. Keep clean. Apply warm compresses.  Follow up with your primary care physician for follow-up.  Follow-up with surgery as needed as discussed.    Return to Urgent care for new or worsening concerns.

## 2019-03-07 ENCOUNTER — Ambulatory Visit: Payer: Managed Care, Other (non HMO) | Admitting: Obstetrics and Gynecology

## 2019-03-08 ENCOUNTER — Other Ambulatory Visit: Payer: Self-pay | Admitting: Family Medicine

## 2019-03-08 ENCOUNTER — Ambulatory Visit
Admission: RE | Admit: 2019-03-08 | Discharge: 2019-03-08 | Disposition: A | Discharge status: Home / Self Care | Payer: Managed Care, Other (non HMO) | Source: Ambulatory Visit | Attending: Family Medicine | Admitting: Family Medicine

## 2019-03-08 ENCOUNTER — Other Ambulatory Visit: Payer: Self-pay

## 2019-03-08 ENCOUNTER — Other Ambulatory Visit (HOSPITAL_COMMUNITY): Payer: Self-pay | Admitting: Family Medicine

## 2019-03-08 DIAGNOSIS — L03319 Cellulitis of trunk, unspecified: Secondary | ICD-10-CM | POA: Insufficient documentation

## 2019-03-20 ENCOUNTER — Other Ambulatory Visit: Payer: Self-pay | Admitting: Obstetrics and Gynecology

## 2019-03-20 DIAGNOSIS — Z30011 Encounter for initial prescription of contraceptive pills: Secondary | ICD-10-CM

## 2019-03-20 DIAGNOSIS — N926 Irregular menstruation, unspecified: Secondary | ICD-10-CM

## 2019-03-21 ENCOUNTER — Encounter: Payer: Self-pay | Admitting: Certified Nurse Midwife

## 2019-04-18 ENCOUNTER — Ambulatory Visit: Payer: Managed Care, Other (non HMO) | Admitting: Obstetrics and Gynecology

## 2019-04-18 ENCOUNTER — Other Ambulatory Visit: Payer: Self-pay

## 2019-04-18 ENCOUNTER — Encounter: Payer: Self-pay | Admitting: Certified Nurse Midwife

## 2019-04-18 ENCOUNTER — Ambulatory Visit (INDEPENDENT_AMBULATORY_CARE_PROVIDER_SITE_OTHER): Payer: Managed Care, Other (non HMO) | Admitting: Certified Nurse Midwife

## 2019-04-18 VITALS — BP 118/94 | HR 100 | Ht 67.5 in | Wt 365.6 lb

## 2019-04-18 DIAGNOSIS — Z6841 Body Mass Index (BMI) 40.0 and over, adult: Secondary | ICD-10-CM | POA: Diagnosis not present

## 2019-04-18 DIAGNOSIS — Z30011 Encounter for initial prescription of contraceptive pills: Secondary | ICD-10-CM

## 2019-04-18 DIAGNOSIS — Z3041 Encounter for surveillance of contraceptive pills: Secondary | ICD-10-CM | POA: Diagnosis not present

## 2019-04-18 DIAGNOSIS — N926 Irregular menstruation, unspecified: Secondary | ICD-10-CM

## 2019-04-18 DIAGNOSIS — Z01419 Encounter for gynecological examination (general) (routine) without abnormal findings: Secondary | ICD-10-CM | POA: Diagnosis not present

## 2019-04-18 MED ORDER — LEVONORGESTREL-ETHINYL ESTRAD 0.1-20 MG-MCG PO TABS: 1.0000 | ORAL_TABLET | Freq: Every day | ORAL | 11 refills | Status: DC

## 2019-04-18 NOTE — Patient Instructions (Addendum)
Preventive Care 20-35 Years Old, Female Preventive care refers to visits with your health care provider and lifestyle choices that can promote health and wellness. This includes:  A yearly physical exam. This may also be called an annual well check.  Regular dental visits and eye exams.  Immunizations.  Screening for certain conditions.  Healthy lifestyle choices, such as eating a healthy diet, getting regular exercise, not using drugs or products that contain nicotine and tobacco, and limiting alcohol use. What can I expect for my preventive care visit? Physical exam Your health care provider will check your:  Height and weight. This may be used to calculate body mass index (BMI), which tells if you are at a healthy weight.  Heart rate and blood pressure.  Skin for abnormal spots. Counseling Your health care provider may ask you questions about your:  Alcohol, tobacco, and drug use.  Emotional well-being.  Home and relationship well-being.  Sexual activity.  Eating habits.  Work and work Statistician.  Method of birth control.  Menstrual cycle.  Pregnancy history. What immunizations do I need?  Influenza (flu) vaccine  This is recommended every year. Tetanus, diphtheria, and pertussis (Tdap) vaccine  You may need a Td booster every 10 years. Varicella (chickenpox) vaccine  You may need this if you have not been vaccinated. Human papillomavirus (HPV) vaccine  If recommended by your health care provider, you may need three doses over 6 months. Measles, mumps, and rubella (MMR) vaccine  You may need at least one dose of MMR. You may also need a second dose. Meningococcal conjugate (MenACWY) vaccine  One dose is recommended if you are age 75-21 years and a first-year college student living in a residence hall, or if you have one of several medical conditions. You may also need additional booster doses. Pneumococcal conjugate (PCV13) vaccine  You may need  this if you have certain conditions and were not previously vaccinated. Pneumococcal polysaccharide (PPSV23) vaccine  You may need one or two doses if you smoke cigarettes or if you have certain conditions. Hepatitis A vaccine  You may need this if you have certain conditions or if you travel or work in places where you may be exposed to hepatitis A. Hepatitis B vaccine  You may need this if you have certain conditions or if you travel or work in places where you may be exposed to hepatitis B. Haemophilus influenzae type b (Hib) vaccine  You may need this if you have certain conditions. You may receive vaccines as individual doses or as more than one vaccine together in one shot (combination vaccines). Talk with your health care provider about the risks and benefits of combination vaccines. What tests do I need?  Blood tests  Lipid and cholesterol levels. These may be checked every 5 years starting at age 33.  Hepatitis C test.  Hepatitis B test. Screening  Diabetes screening. This is done by checking your blood sugar (glucose) after you have not eaten for a while (fasting).  Sexually transmitted disease (STD) testing.  BRCA-related cancer screening. This may be done if you have a family history of breast, ovarian, tubal, or peritoneal cancers.  Pelvic exam and Pap test. This may be done every 3 years starting at age 76. Starting at age 102, this may be done every 5 years if you have a Pap test in combination with an HPV test. Talk with your health care provider about your test results, treatment options, and if necessary, the need for more tests.  Follow these instructions at home: Eating and drinking   Eat a diet that includes fresh fruits and vegetables, whole grains, lean protein, and low-fat dairy.  Take vitamin and mineral supplements as recommended by your health care provider.  Do not drink alcohol if: ? Your health care provider tells you not to drink. ? You are  pregnant, may be pregnant, or are planning to become pregnant.  If you drink alcohol: ? Limit how much you have to 0-1 drink a day. ? Be aware of how much alcohol is in your drink. In the [place] one drink equals one 12 oz bottle of beer (355 mL), one 5 oz glass of wine (148 mL), or one 1 oz glass of hard liquor (44 mL). Lifestyle  Take daily care of your teeth and gums.  Stay active. Exercise for at least 30 minutes on 5 or more days each week.  Do not use any products that contain nicotine or tobacco, such as cigarettes, e-cigarettes, and chewing tobacco. If you need help quitting, ask your health care provider.  If you are sexually active, practice safe sex. Use a condom or other form of birth control (contraception) in order to prevent pregnancy and STIs (sexually transmitted infections). If you plan to become pregnant, see your health care provider for a preconception visit. What's next?  Visit your health care provider once a year for a well check visit.  Ask your health care provider how often you should have your eyes and teeth checked.  Stay up to date on all vaccines. This information is not intended to replace advice given to you by your health care provider. Make sure you discuss any questions you have with your health care provider. Document Released: 10/28/2001 Document Revised: 05/13/2018 Document Reviewed: 05/13/2018 Elsevier Patient Education  [place]. Ethinyl Estradiol; Levonorgestrel tablets What is this medicine? ETHINYL ESTRADIOL; LEVONORGESTREL (ETH in il es tra DYE ole; LEE voh nor jes trel) is an oral contraceptive. It combines two types of female hormones, an estrogen and a progestin. They are used to prevent ovulation and pregnancy. This medicine may be used for other purposes; ask your health care provider or pharmacist if you have questions. COMMON BRAND NAME(S): [place], [place], [place], Amethia, Amethia Lo, [place], [place], [place], Aubra-28,  Aviane, Camrese, Camrese Lo, [place], [place], [place], [place], [place], [place], [place], [place], [place], [place], [place], [place], [place], [place], [place], [place], Levonorgestrel/Ethinyl Estradiol, [place], [place], [place], [place], [place], [place], [place], [place], [place], [place], [place], [place], [place], [place], [place], [place], [place], [place], [place], Reinaldo Berber What should I tell my health care provider before I take this medicine? They need to know if you have or ever had any of these conditions:  abnormal vaginal bleeding  blood vessel disease or blood clots  breast, cervical, endometrial, ovarian, liver, or uterine cancer  diabetes  gallbladder disease  heart disease or recent heart attack  high blood pressure  high cholesterol  kidney disease  liver disease  migraine headaches  stroke  systemic lupus erythematosus (SLE)  tobacco smoker  an unusual or allergic reaction to estrogens, progestins, other medicines, foods, dyes, or preservatives  pregnant or trying to get pregnant  breast-feeding How should I use this medicine? Take this medicine by mouth. To reduce nausea, this medicine may be taken with food. Follow the directions on the prescription label. Take this medicine at the same time each day and in the order directed on the package. Do not take your medicine more often than directed. Contact  your pediatrician regarding the use of this medicine in children. Special care may be needed. This medicine has been used in female children who have started having menstrual periods. A patient package insert for the product will be given with each prescription and refill. Read this sheet carefully each time. The sheet may change frequently. Overdosage: If you think you have taken too much of this medicine contact a poison control center or emergency room at once. NOTE: This medicine is only for you. Do not share this  medicine with others. What if I miss a dose? If you miss a dose, refer to the patient information sheet you received with your medicine for direction. If you miss more than one pill, this medicine may not be as effective and you may need to use another form of birth control. What may interact with this medicine? Do not take this medicine with the following medication:  dasabuvir; ombitasvir; paritaprevir; ritonavir  ombitasvir; paritaprevir; ritonavir This medicine may also interact with the following medications:  acetaminophen  antibiotics or medicines for infections, especially rifampin, rifabutin, rifapentine, and griseofulvin, and possibly penicillins or tetracyclines  aprepitant  ascorbic acid (vitamin C)  atorvastatin  barbiturate medicines, such as phenobarbital  bosentan  carbamazepine  caffeine  clofibrate  cyclosporine  dantrolene  doxercalciferol  felbamate  grapefruit juice  hydrocortisone  medicines for anxiety or sleeping problems, such as diazepam or temazepam  medicines for diabetes, including pioglitazone  mineral oil  modafinil  mycophenolate  nefazodone  oxcarbazepine  phenytoin  prednisolone  ritonavir or other medicines for HIV infection or AIDS  rosuvastatin  selegiline  soy isoflavones supplements  St. John's wort  tamoxifen or raloxifene  theophylline  thyroid hormones  topiramate  warfarin This list may not describe all possible interactions. Give your health care provider a list of all the medicines, herbs, non-prescription drugs, or dietary supplements you use. Also tell them if you smoke, drink alcohol, or use illegal drugs. Some items may interact with your medicine. What should I watch for while using this medicine? Visit your doctor or health care professional for regular checks on your progress. You will need a regular breast and pelvic exam and Pap smear while on this medicine. Use an additional  method of contraception during the first cycle that you take these tablets. If you have any reason to think you are pregnant, stop taking this medicine right away and contact your doctor or health care professional. If you are taking this medicine for hormone related problems, it may take several cycles of use to see improvement in your condition. Smoking increases the risk of getting a blood clot or having a stroke while you are taking birth control pills, especially if you are more than 35 years old. You are strongly advised not to smoke. This medicine can make your body retain fluid, making your fingers, hands, or ankles swell. Your blood pressure can go up. Contact your doctor or health care professional if you feel you are retaining fluid. This medicine can make you more sensitive to the sun. Keep out of the sun. If you cannot avoid being in the sun, wear protective clothing and use sunscreen. Do not use sun lamps or tanning beds/booths. If you wear contact lenses and notice visual changes, or if the lenses begin to feel uncomfortable, consult your eye care specialist. In some women, tenderness, swelling, or minor bleeding of the gums may occur. Notify your dentist if this happens. Brushing and flossing your teeth regularly may  help limit this. See your dentist regularly and inform your dentist of the medicines you are taking. If you are going to have elective surgery, you may need to stop taking this medicine before the surgery. Consult your health care professional for advice. This medicine does not protect you against HIV infection (AIDS) or any other sexually transmitted diseases. What side effects may I notice from receiving this medicine? Side effects that you should report to your doctor or health care professional as soon as possible:  breast tissue changes or discharge  changes in vaginal bleeding during your period or between your periods  chest pain  coughing up blood  dizziness  or fainting spells  headaches or migraines  leg, arm or groin pain  severe or sudden headaches  stomach pain (severe)  sudden shortness of breath  sudden loss of coordination, especially on one side of the body  speech problems  symptoms of vaginal infection like itching, irritation or unusual discharge  tenderness in the upper abdomen  vomiting  weakness or numbness in the arms or legs, especially on one side of the body  yellowing of the eyes or skin Side effects that usually do not require medical attention (report to your doctor or health care professional if they continue or are bothersome):  breakthrough bleeding and spotting that continues beyond the 3 initial cycles of pills  breast tenderness  mood changes, anxiety, depression, frustration, anger, or emotional outbursts  increased sensitivity to sun or ultraviolet light  nausea  skin rash, acne, or brown spots on the skin  weight gain (slight) This list may not describe all possible side effects. Call your doctor for medical advice about side effects. You may report side effects to FDA at 1-800-FDA-1088. Where should I keep my medicine? Keep out of the reach of children. Store at room temperature between 15 and 30 degrees C (59 and 86 degrees F). Throw away any unused medicine after the expiration date. NOTE: This sheet is a summary. It may not cover all possible information. If you have questions about this medicine, talk to your doctor, pharmacist, or health care provider.  2020 Elsevier/Gold Standard (2016-05-12 07:58:22) Drospirenone tablets (contraception) What is this medicine? DROSPIRENONE (dro SPY re nown) is an oral contraceptive (birth control pill). The product contains a female hormone known as a progestin. It is used to prevent pregnancy. This medicine may be used for other purposes; ask your health care provider or pharmacist if you have questions. COMMON BRAND NAME(S): FeRiva 21/7,  SLYND What should I tell my health care provider before I take this medicine? They need to know if you have any of these conditions:  abnormal vaginal bleeding  adrenal gland disease  blood vessel disease or blood clots  breast, cervical, endometrial, ovarian, liver, or uterine cancer  diabetes  heart disease or recent heart attack  high potassium level  kidney disease  liver disease  mental depression  migraine headaches  stroke  an unusual or allergic reaction to drospirenone, progestins, or other medicines, foods, dyes, or preservatives  pregnant or trying to get pregnant  breast-feeding How should I use this medicine? Take this medicine by mouth. To reduce nausea, this medicine may be taken with food. Follow the directions on the prescription label. Take this medicine at the same time each day and in the order directed on the package. Do not take your medicine more often than directed. A patient package insert for the product will be given with each prescription and  refill. Read this sheet carefully each time. The sheet may change frequently. Talk to your pediatrician regarding the use of this medicine in children. Special care may be needed. This medicine has been used in female children who have started having menstrual periods. Overdosage: If you think you have taken too much of this medicine contact a poison control center or emergency room at once. NOTE: This medicine is only for you. Do not share this medicine with others. What if I miss a dose? If you miss a dose, take it as soon as you can and refer to the patient information sheet you received with your medicine for direction. If you miss more than one pill, this medicine may not be as effective and you may need to use another form of birth control. What may interact with this medicine? Do not take this medicine with any of the following medications:  atazanavir; cobicistat  bosentan  fosamprenavir This  medicine may also interact with the following medications:  aprepitant  barbiturates like phenobarbital, primidone  carbamazepine  certain antibiotics like clarithromycin, rifampin, rifabutin, rifapentine  certain antivirals for HIV or hepatitis  certain diuretics like amiloride, spironolactone, triamterene  certain medicines for fungal infections like griseofulvin, ketoconazole, itraconazole, voriconazole  certain medicines for blood pressure, heart disease  cyclosporine  felbamate  heparin  medicines for diabetes  modafinil  NSAIDs, medicines for pain and inflammation, like ibuprofen or naproxen  oxcarbazepine  phenytoin  potassium supplements  rufinamide  St. John's wort  topiramate This list may not describe all possible interactions. Give your health care provider a list of all the medicines, herbs, non-prescription drugs, or dietary supplements you use. Also tell them if you smoke, drink alcohol, or use illegal drugs. Some items may interact with your medicine. What should I watch for while using this medicine? Visit your doctor or health care professional for regular checks on your progress. You will need a regular breast and pelvic exam and Pap smear while on this medicine. You may need blood work done while you are taking this medicine. If you have any reason to think you are pregnant, stop taking this medicine right away and contact your doctor or health care professional. This medicine does not protect you against HIV infection (AIDS) or any other sexually transmitted diseases. If you are going to have elective surgery, you may need to stop taking this medicine before the surgery. Consult your health care professional for advice. What side effects may I notice from receiving this medicine? Side effects that you should report to your doctor or health care professional as soon as possible:  allergic reactions like skin rash, itching or hives, swelling of the  face, lips, or tongue  breast tissue changes or discharge  depressed mood  severe pain, swelling, or tenderness in the abdomen  signs and symptoms of a blood clot such as chest pain; shortness of breath; pain, swelling, or warmth in the leg  signs and symptoms of increased potassium like muscle weakness; chest pain; or fast, irregular heartbeat  signs and symptoms of liver injury like dark yellow or brown urine; general ill feeling or flu-like symptoms; light-colored stools; loss of appetite; nausea; right upper belly pain; unusually weak or tired; yellowing of the eyes or skin  signs and symptoms of a stroke like changes in vision; confusion; trouble speaking or understanding; severe headaches; sudden numbness or weakness of the face, arm or leg; trouble walking; dizziness; loss of balance or coordination  unusual vaginal bleeding  unusually weak or tired Side effects that usually do not require medical attention (report these to your doctor or health care professional if they continue or are bothersome):  acne  breast tenderness  headache  menstrual cramps  nausea  weight gain This list may not describe all possible side effects. Call your doctor for medical advice about side effects. You may report side effects to FDA at 1-800-FDA-1088. Where should I keep my medicine? Keep out of the reach of children. Store at room temperature between 20 and 25 degrees C (68 and 77 degrees F). Throw away any unused medicine after the expiration date. NOTE: This sheet is a summary. It may not cover all possible information. If you have questions about this medicine, talk to your doctor, pharmacist, or health care provider.  2020 Elsevier/Gold Standard (2018-02-10 15:01:56)

## 2019-04-18 NOTE — Progress Notes (Signed)
Patient here for annual exam. No complaints.  

## 2019-04-18 NOTE — Progress Notes (Signed)
ANNUAL PREVENTATIVE CARE GYN  ENCOUNTER NOTE  Subjective:       Karen Schroeder is a 33 y.o. G0P0000 female here for a routine annual gynecologic exam.  Current complaints: 1.  Needs OCP refill  Denies difficulty breathing or respiratory distress, chest pain, abdominal pain, excessive vaginal bleeding, dysuria, and leg pain or swelling.  History significant for headaches.    Gynecologic History  Patient's last menstrual period was 03/26/2019 (exact date). Period Cycle (Days): 28 Period Duration (Days): 7 Period Pattern: Regular Menstrual Flow: Heavy, Moderate Menstrual Control: Maxi pad Dysmenorrhea: (!) Mild Dysmenorrhea Symptoms: Cramping  Contraception: OCP (estrogen/progesterone), Lessina-28  Last Pap: 11/2017. Results were: Negative/Negative  Obstetric History  OB History  Gravida Para Term Preterm AB Living  0 0 0 0 0 0  SAB TAB Ectopic Multiple Live Births  0 0 0 0 0    Past Medical History:  Diagnosis Date  . Allergy   . IBS (irritable bowel syndrome)   . Obesity   . Vitamin D deficiency     Past Surgical History:  Procedure Laterality Date  . ACNE CYST REMOVAL     on back  . CHOLECYSTECTOMY      Current Outpatient Medications on File Prior to Visit  Medication Sig Dispense Refill  . albuterol (PROVENTIL HFA;VENTOLIN HFA) 108 (90 BASE) MCG/ACT inhaler Inhale 2 puffs into the lungs every 4 (four) hours as needed for wheezing or shortness of breath.    Marland Kitchen. azelastine (ASTELIN) 0.1 % nasal spray Place into the nose.    . Cholecalciferol (VITAMIN D-3 PO) Take by mouth.    . colestipol (COLESTID) 1 g tablet Take 0.5 g by mouth daily.     . hyoscyamine (LEVSIN SL) 0.125 MG SL tablet Place 0.125 mg under the tongue as needed.     . Multiple Vitamin (MULTIVITAMIN) capsule Take 1 capsule by mouth daily.     No current facility-administered medications on file prior to visit.     No Known Allergies  Social History   Socioeconomic History  . Marital  status: Single    Spouse name: Not on file  . Number of children: Not on file  . Years of education: Not on file  . Highest education level: Not on file  Occupational History  . Not on file  Social Needs  . Financial resource strain: Not on file  . Food insecurity    Worry: Not on file    Inability: Not on file  . Transportation needs    Medical: Not on file    Non-medical: Not on file  Tobacco Use  . Smoking status: Former Games developermoker  . Smokeless tobacco: Never Used  Substance and Sexual Activity  . Alcohol use: Yes    Comment: occassional  . Drug use: No  . Sexual activity: Yes    Birth control/protection: Pill  Lifestyle  . Physical activity    Days per week: Not on file    Minutes per session: Not on file  . Stress: Not on file  Relationships  . Social Musicianconnections    Talks on phone: Not on file    Gets together: Not on file    Attends religious service: Not on file    Active member of club or organization: Not on file    Attends meetings of clubs or organizations: Not on file    Relationship status: Not on file  . Intimate partner violence    Fear of current or ex partner: Not on  file    Emotionally abused: Not on file    Physically abused: Not on file    Forced sexual activity: Not on file  Other Topics Concern  . Not on file  Social History Narrative  . Not on file    Family History  Problem Relation Age of Onset  . Uterine cancer Mother 74  . Pancreatic cancer Maternal Grandmother   . Liver cancer Maternal Grandmother   . Throat cancer Paternal Grandmother   . Prostate cancer Maternal Grandfather   . Breast cancer Cousin   . Sudden Cardiac Death Father   . Ovarian cancer Neg Hx   . Colon cancer Neg Hx     The following portions of the patient's history were reviewed and updated as appropriate: allergies, current medications, past family history, past medical history, past social history, past surgical history and problem list.  Review of  Systems  ROS negative except as noted above. Information obtained from patient.    Objective:   BP (!) 118/94   Pulse 100   Ht 5' 7.5" (1.715 m)   Wt (!) 365 lb 9.6 oz (165.8 kg)   LMP 03/26/2019 (Exact Date)   BMI 56.42 kg/m    CONSTITUTIONAL: Well-developed, well-nourished female in no acute distress.   PSYCHIATRIC: Normal mood and affect. Normal behavior. Normal judgment and thought content.  [place]: Alert and oriented to person, place, and time. Normal muscle tone coordination. No cranial nerve deficit noted.  HENT:  Normocephalic, atraumatic, External right and left ear normal.   EYES: Conjunctivae and EOM are normal. Pupils are equal and round.   NECK: Normal range of motion, supple, no masses.  Normal thyroid.   SKIN: Skin is warm and dry. No rash noted. Not diaphoretic. No erythema. No pallor.  CARDIOVASCULAR: Normal heart rate noted, regular rhythm, no murmur.  RESPIRATORY: Clear to auscultation bilaterally. Effort and breath sounds normal, no problems with respiration noted.  BREASTS: Symmetric in size. No masses, skin changes, nipple drainage, or lymphadenopathy.  ABDOMEN: Soft, normal bowel sounds, no distention noted.  No tenderness, rebound or guarding. Obese.   PELVIC:  External Genitalia: Normal  BUS: Normal  Vagina: Normal  Cervix: Normal  Uterus: Normal  Adnexa: Normal   MUSCULOSKELETAL: Normal range of motion. No tenderness.  No cyanosis, clubbing, or edema.  2+ distal pulses.  LYMPHATIC: No Axillary, Supraclavicular, or Inguinal Adenopathy.  Assessment:   Annual gynecologic examination 33 y.o.   Contraception: OCP (estrogen/progesterone), Lessina-28   Obesity 3   Problem List Items Addressed This Visit    None    Visit Diagnoses    Well woman exam    -  Primary   BMI 50.0-59.9, adult ([place])       Surveillance for birth control, oral contraceptives          Plan:   Pap: Not needed  Labs: Declined, managed by PCP  Routine  preventative health maintenance measures emphasized: Exercise/Diet/Weight control, Tobacco Warnings, Alcohol/Substance use risks and Stress Management; see AVS  Birth control refilled provided, see orders. Discussed progesterone only options  Reviewed red flag symptoms and when to call  RTC x 1 year for ANNUAL EXAM or sooner if needed   Diona Fanti, CNM [facility], [facility] 04/18/19 10:30 AM

## 2019-08-18 IMAGING — CR DG CHEST 2V
4 series · 4 of 4 positions shown · non-contrast
Comparison: 06/22/2015 chest radiograph.

CLINICAL DATA: Dyspnea

EXAM:
CHEST - 2 VIEW

[chest pa (1 of 2)]
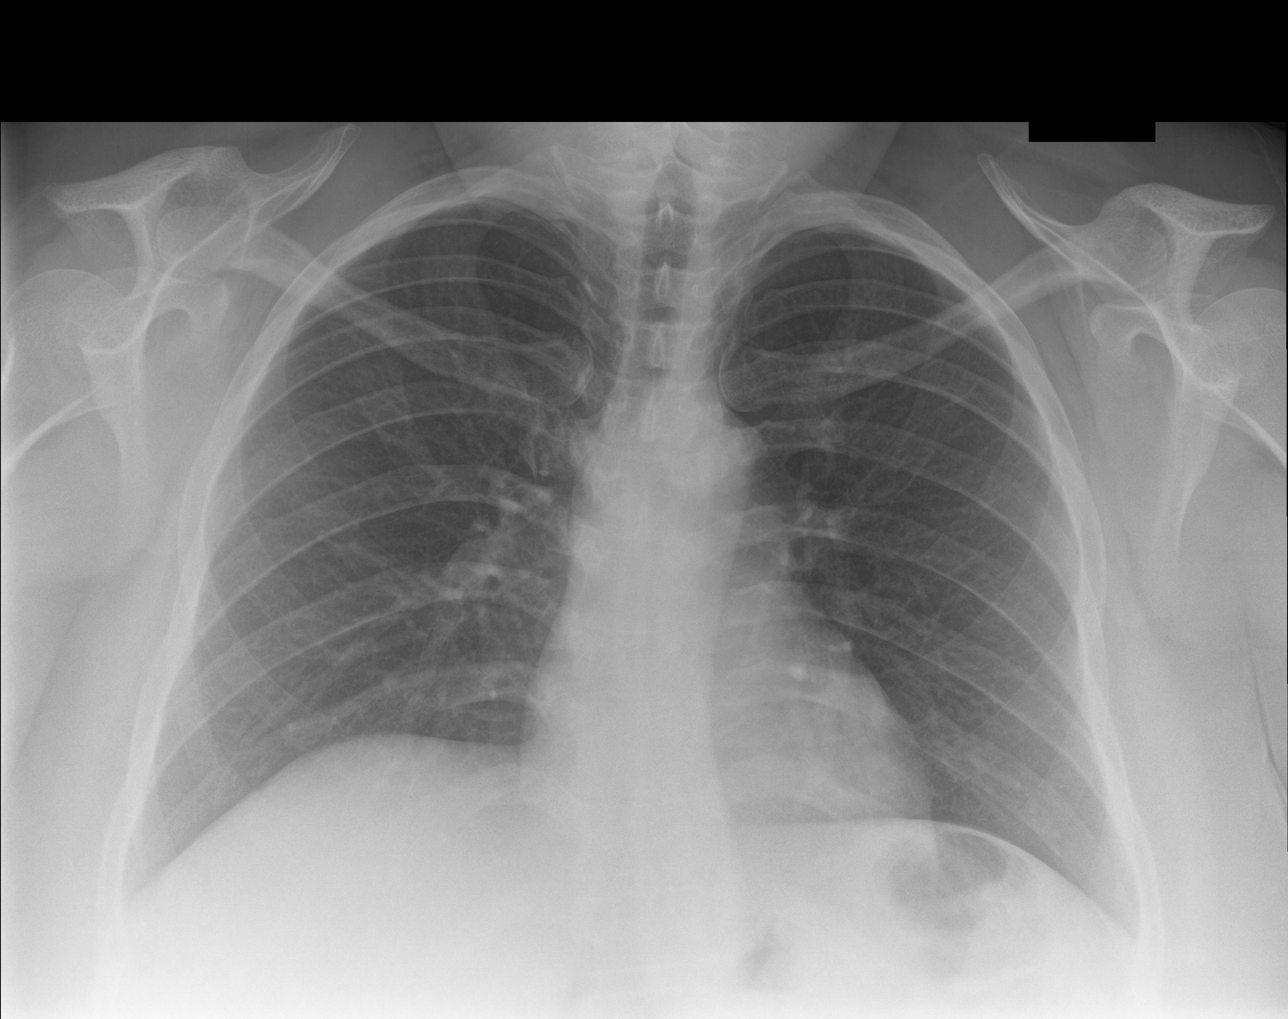

[chest lat (1 of 2)]
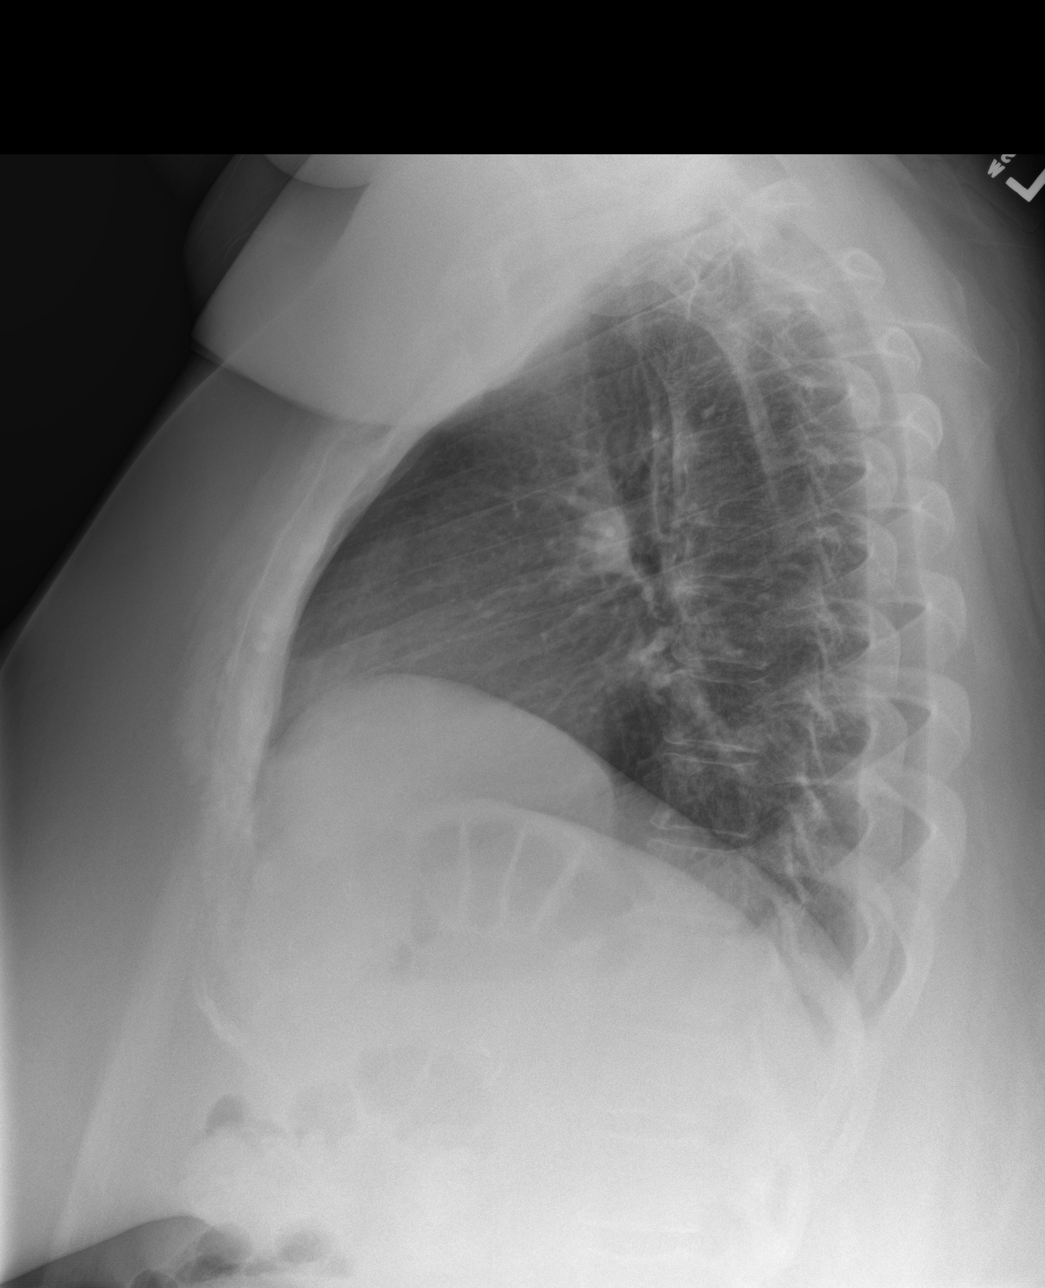

[chest pa (2 of 2)]
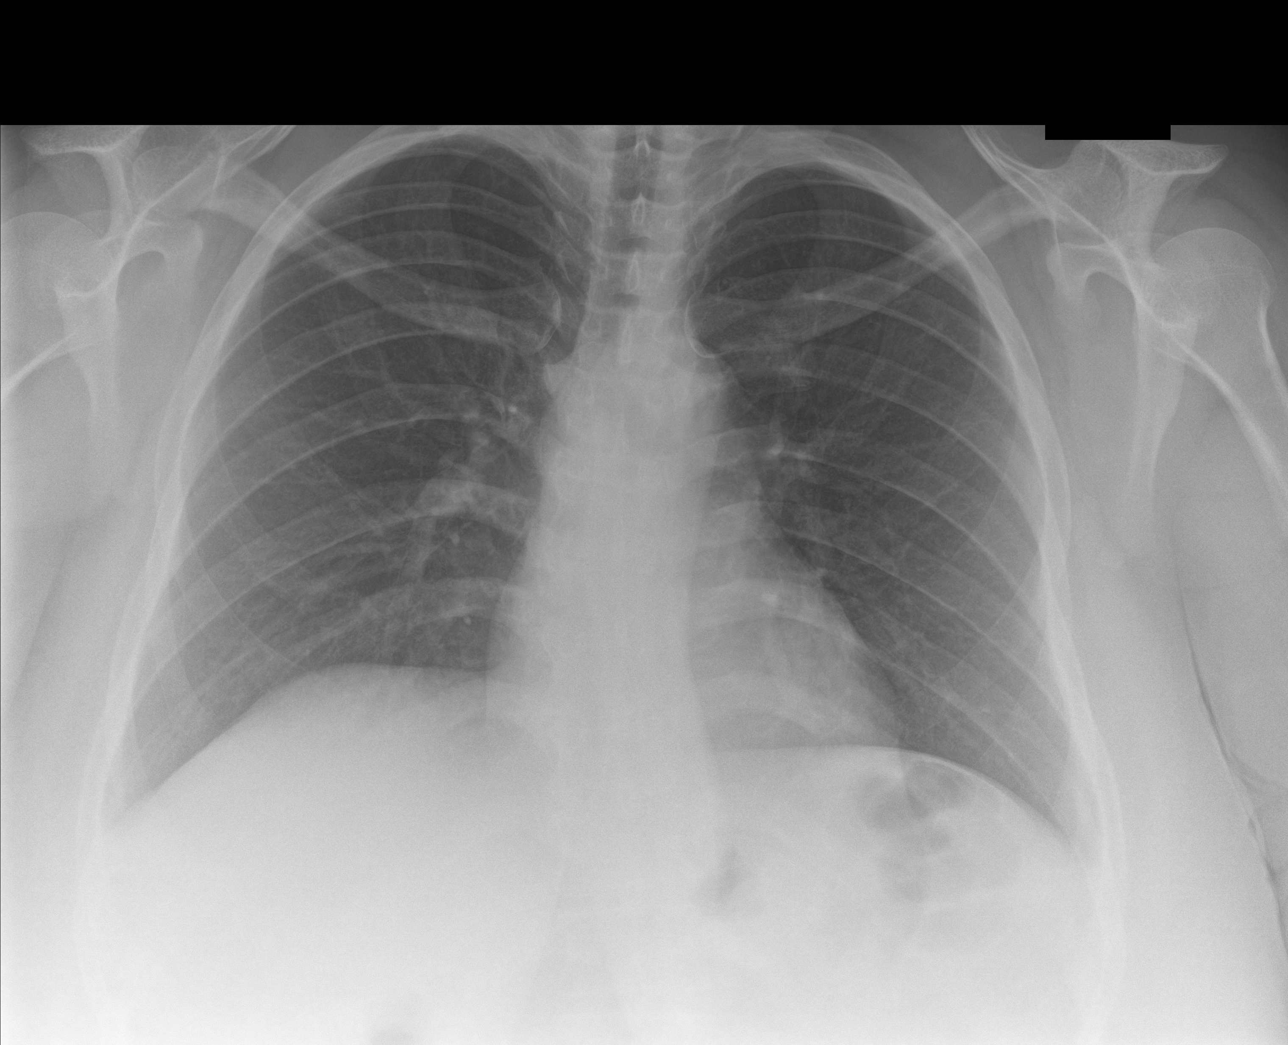

[chest lat (2 of 2)]
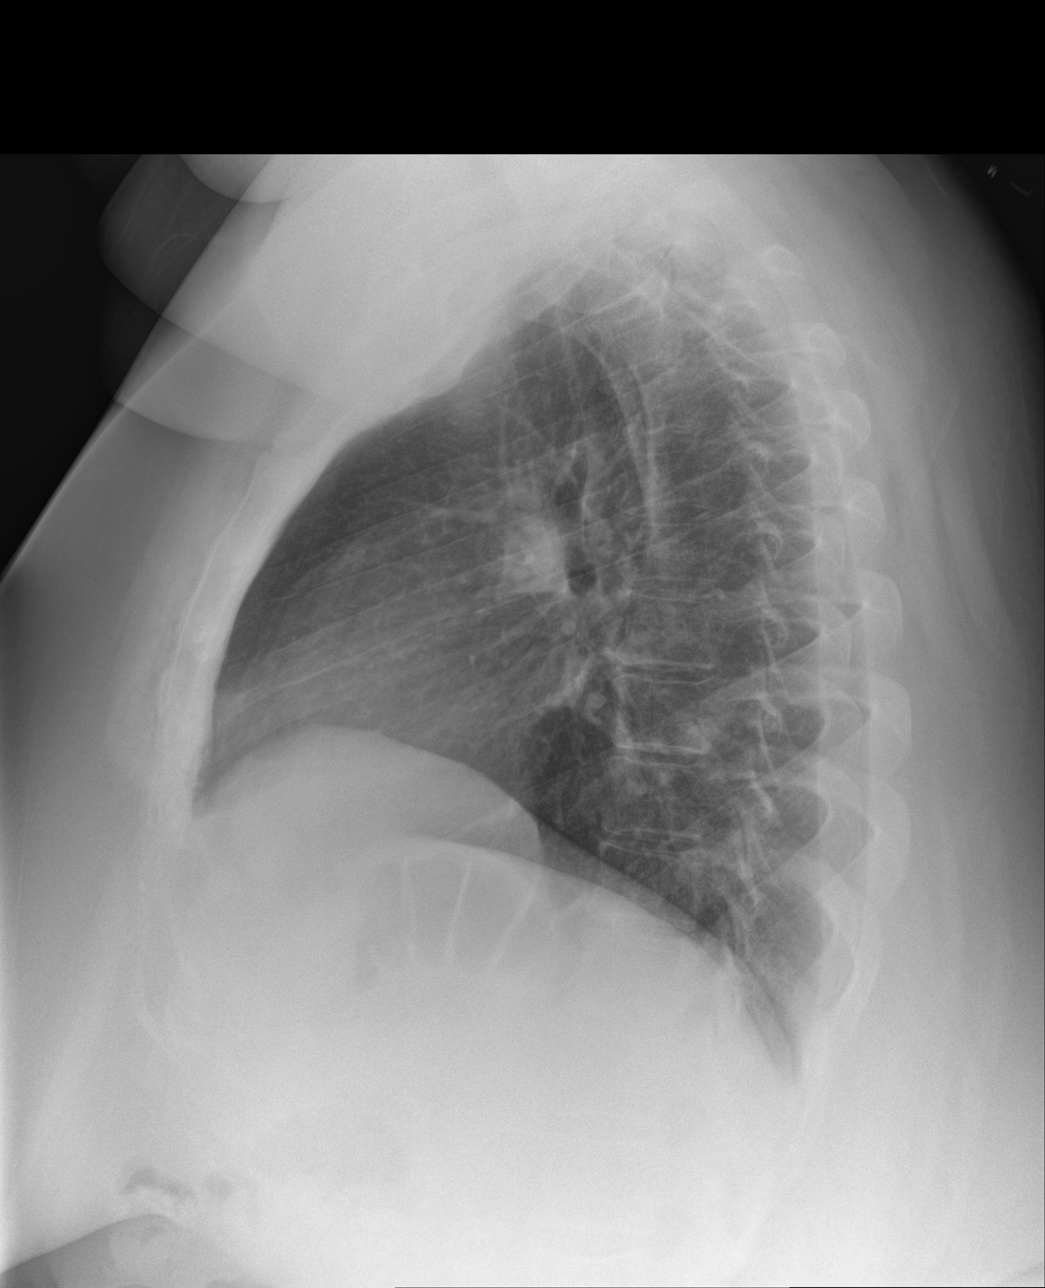

[4 of 4 positions shown; findings below may reference images not displayed]

FINDINGS: Stable cardiomediastinal silhouette with normal heart size. No
pneumothorax. No pleural effusion. Lungs appear clear, with no acute
consolidative airspace disease and no pulmonary edema.
IMPRESSION: No active cardiopulmonary disease.

## 2019-08-29 HISTORY — PX: GASTRIC BYPASS: SHX52

## 2019-11-25 ENCOUNTER — Ambulatory Visit: Payer: Self-pay | Attending: Internal Medicine

## 2019-11-25 ENCOUNTER — Ambulatory Visit: Payer: Self-pay

## 2019-11-25 DIAGNOSIS — Z23 Encounter for immunization: Secondary | ICD-10-CM

## 2019-11-25 NOTE — Progress Notes (Signed)
   Covid-19 Vaccination Clinic  Name:  Karen Schroeder    MRN: [id number] DOB: 1986/08/09  11/25/2019  [name] was observed post Covid-19 immunization for 15 minutes without incident. She was provided with Vaccine Information Sheet and instruction to access the V-Safe system.   [name] was instructed to call 911 with any severe reactions post vaccine: Marland Kitchen Difficulty breathing  . Swelling of face and throat  . A fast heartbeat  . A bad rash all over body  . Dizziness and weakness   Immunizations Administered    Name Date Dose VIS Date Route   Moderna COVID-19 Vaccine 11/25/2019 10:23 AM 0.5 mL 08/16/2019 Intramuscular   Manufacturer: Moderna   Lot: [id number]   NDC: 82500-370-48

## 2019-12-27 ENCOUNTER — Ambulatory Visit: Payer: Self-pay | Attending: Internal Medicine

## 2019-12-27 DIAGNOSIS — Z23 Encounter for immunization: Secondary | ICD-10-CM

## 2019-12-27 NOTE — Progress Notes (Signed)
   Covid-19 Vaccination Clinic  Name:  Karen Schroeder    MRN: [id number] DOB: July 15, 1986  12/27/2019  [name] was observed post Covid-19 immunization for 15 minutes without incident. She was provided with Vaccine Information Sheet and instruction to access the V-Safe system.   [name] was instructed to call 911 with any severe reactions post vaccine: Marland Kitchen Difficulty breathing  . Swelling of face and throat  . A fast heartbeat  . A bad rash all over body  . Dizziness and weakness   Immunizations Administered    Name Date Dose VIS Date Route   Moderna COVID-19 Vaccine 12/27/2019 10:10 AM 0.5 mL 08/16/2019 Intramuscular   Manufacturer: Moderna   Lot: [id number]   NDC: 62563-893-73

## 2020-04-19 ENCOUNTER — Encounter: Payer: BC Managed Care – PPO | Admitting: Certified Nurse Midwife

## 2020-05-04 ENCOUNTER — Ambulatory Visit (INDEPENDENT_AMBULATORY_CARE_PROVIDER_SITE_OTHER): Payer: BC Managed Care – PPO | Admitting: Certified Nurse Midwife

## 2020-05-04 ENCOUNTER — Other Ambulatory Visit: Payer: Self-pay

## 2020-05-04 ENCOUNTER — Other Ambulatory Visit (HOSPITAL_COMMUNITY)
Admission: RE | Admit: 2020-05-04 | Discharge: 2020-05-04 | Disposition: A | Discharge status: Home / Self Care | Payer: BC Managed Care – PPO | Source: Ambulatory Visit | Attending: Certified Nurse Midwife | Admitting: Certified Nurse Midwife

## 2020-05-04 ENCOUNTER — Encounter: Payer: Self-pay | Admitting: Certified Nurse Midwife

## 2020-05-04 VITALS — BP 103/74 | HR 79 | Ht 67.5 in | Wt 243.0 lb

## 2020-05-04 DIAGNOSIS — Z113 Encounter for screening for infections with a predominantly sexual mode of transmission: Secondary | ICD-10-CM | POA: Diagnosis present

## 2020-05-04 DIAGNOSIS — Z01419 Encounter for gynecological examination (general) (routine) without abnormal findings: Secondary | ICD-10-CM | POA: Diagnosis present

## 2020-05-04 DIAGNOSIS — L818 Other specified disorders of pigmentation: Secondary | ICD-10-CM

## 2020-05-04 DIAGNOSIS — Z6837 Body mass index (BMI) 37.0-37.9, adult: Secondary | ICD-10-CM

## 2020-05-04 DIAGNOSIS — Z9884 Bariatric surgery status: Secondary | ICD-10-CM | POA: Insufficient documentation

## 2020-05-04 DIAGNOSIS — R350 Frequency of micturition: Secondary | ICD-10-CM

## 2020-05-04 DIAGNOSIS — N3944 Nocturnal enuresis: Secondary | ICD-10-CM

## 2020-05-04 NOTE — Progress Notes (Signed)
ANNUAL PREVENTATIVE CARE GYN  ENCOUNTER NOTE  Subjective:       Karen Schroeder is a 34 y.o. G0P0000 female here for a routine annual gynecologic exam.  Current complaints: 1. Urinary frequency and night time bedwetting 2. Desires STI screening  Status post gastric bypass in 08/2019, current weight loss: 125 pounds.   Denies difficulty breathing or respiratory distress, chest pain, abdominal pain, and leg pain or swelling.    Gynecologic History  Patient's last menstrual period was 04/17/2020 (exact date). Period Cycle (Days): 28 Period Duration (Days): 5/6 Period Pattern: Regular (sometimes a few days earlier than normal) Menstrual Flow: Moderate, Heavy Menstrual Control: Maxi pad Menstrual Control Change Freq (Hours): 2/3 Dysmenorrhea: (!) Moderate (every other month) Dysmenorrhea Symptoms: Cramping, Diarrhea  Contraception: none   Upstream - 05/04/20 1111      Pregnancy Intention Screening   Does the patient want to become pregnant in the next year? No    Does the patient's partner want to become pregnant in the next year? No    Would the patient like to discuss contraceptive options today? No          The pregnancy intention screening data noted above was reviewed. Potential methods of contraception were discussed. The patient elected to proceed with No Method - Other Reason.   Last Pap: 11/2017. Results were: Neg/Neg  Obstetric History  OB History  Gravida Para Term Preterm AB Living  0 0 0 0 0 0  SAB TAB Ectopic Multiple Live Births  0 0 0 0 0    Past Medical History:  Diagnosis Date   Allergy    IBS (irritable bowel syndrome)    Neuropathy    Obesity    Vitamin D deficiency     Past Surgical History:  Procedure Laterality Date   ACNE CYST REMOVAL     on back   CHOLECYSTECTOMY     GASTRIC BYPASS  08/29/2019    Current Outpatient Medications on File Prior to Visit  Medication Sig Dispense Refill   azelastine (ASTELIN) 0.1 % nasal  spray Place into the nose.     Multiple Vitamins-Minerals (BARIATRIC MULTIVITAMINS/IRON PO) Take by mouth in the morning and at bedtime.     vitamin B-12 (CYANOCOBALAMIN) 1000 MCG tablet Take 1,000 mcg by mouth daily.     albuterol (PROVENTIL HFA;VENTOLIN HFA) 108 (90 BASE) MCG/ACT inhaler Inhale 2 puffs into the lungs every 4 (four) hours as needed for wheezing or shortness of breath.     azelastine (ASTELIN) 0.1 % nasal spray Place into the nose.     Cholecalciferol (VITAMIN D-3 PO) Take by mouth.     colestipol (COLESTID) 1 g tablet Take 0.5 g by mouth daily.      hyoscyamine (LEVSIN SL) 0.125 MG SL tablet Place 0.125 mg under the tongue as needed.      levonorgestrel-ethinyl estradiol (LESSINA-28) 0.1-20 MG-MCG tablet Take 1 tablet by mouth daily. 28 tablet 11   Multiple Vitamin (MULTIVITAMIN) capsule Take 1 capsule by mouth daily.     No current facility-administered medications on file prior to visit.    Allergies  Allergen Reactions   Gramineae Pollens Hives    MULBERRY TREES-SNEEZING, SKIN TEST HIVES   Lactose Other (See Comments)    IBS    Social History   Socioeconomic History   Marital status: Single    Spouse name: Not on file   Number of children: Not on file   Years of education: Not on file  Highest education level: Not on file  Occupational History   Not on file  Tobacco Use   Smoking status: Former Smoker   Smokeless tobacco: Never Used  Building services engineer Use: Never used  Substance and Sexual Activity   Alcohol use: Not Currently   Drug use: No   Sexual activity: Yes    Birth control/protection: None  Other Topics Concern   Not on file  Social History Narrative   Not on file   Social Determinants of Health   Financial Resource Strain:    Difficulty of Paying Living Expenses: Not on file  Food Insecurity:    Worried About Programme researcher, broadcasting/film/video in the Last Year: Not on file   [employer] of Food in the Last Year: Not on file   Transportation Needs:    Lack of Transportation (Medical): Not on file   Lack of Transportation (Non-Medical): Not on file  Physical Activity:    Days of Exercise per Week: Not on file   Minutes of Exercise per Session: Not on file  Stress:    Feeling of Stress : Not on file  Social Connections:    Frequency of Communication with Friends and Family: Not on file   Frequency of Social Gatherings with Friends and Family: Not on file   Attends Religious Services: Not on file   Active Member of Clubs or Organizations: Not on file   Attends Banker Meetings: Not on file   Marital Status: Not on file  Intimate Partner Violence:    Fear of Current or Ex-Partner: Not on file   Emotionally Abused: Not on file   Physically Abused: Not on file   Sexually Abused: Not on file    Family History  Problem Relation Age of Onset   Uterine cancer Mother 55   Pancreatic cancer Maternal Grandmother    Liver cancer Maternal Grandmother    Throat cancer Paternal Grandmother    Prostate cancer Maternal Grandfather    Breast cancer Cousin    Sudden Cardiac Death Father    Ovarian cancer Neg Hx    Colon cancer Neg Hx     The following portions of the patient's history were reviewed and updated as appropriate: allergies, current medications, past family history, past medical history, past social history, past surgical history and problem list.  Review of Systems  ROS negative except as noted above. Information obtained from patient.    Objective:   BP 103/74    Pulse 79    Ht 5' 7.5" (1.715 m)    Wt 243 lb (110.2 kg)    LMP 04/17/2020 (Exact Date)    BMI 37.50 kg/m    CONSTITUTIONAL: Well-developed, well-nourished female in no acute distress.   PSYCHIATRIC: Normal mood and affect. Normal behavior. Normal judgment and thought content.  NEUROLGIC: Alert and oriented to person, place, and time. Normal muscle tone coordination. No cranial nerve deficit  noted.  HENT:  Normocephalic, atraumatic, External right and left ear normal.  EYES: Conjunctivae and EOM are normal. Pupils are equal and round.   NECK: Normal range of motion, supple, no masses.  Normal thyroid.   SKIN: Skin is warm and dry. No rash noted. Not diaphoretic. No erythema. No pallor. Professional tattoos present.   CARDIOVASCULAR: Normal heart rate noted, regular rhythm, no murmur.  RESPIRATORY: Clear to auscultation bilaterally. Effort and breath sounds normal, no problems with respiration noted.  BREASTS: Symmetric in size. No masses, skin changes, nipple drainage, or  lymphadenopathy.  ABDOMEN: Soft, normal bowel sounds, no distention noted.  No tenderness, rebound or guarding.   PELVIC:  External Genitalia: Normal  Vagina: Normal  Cervix: Normal  Uterus: Normal  Adnexa: Normal   MUSCULOSKELETAL: Normal range of motion. No tenderness.  No cyanosis, clubbing, or edema.  2+ distal pulses.  LYMPHATIC: No Axillary, Supraclavicular, or Inguinal Adenopathy.  Assessment:   Annual gynecologic examination 34 y.o.   Contraception: none   Obesity 2   Problem List Items Addressed This Visit    None    Visit Diagnoses    Well woman exam    -  Primary   Relevant Orders   RPR   Hepatitis B surface antigen   HIV Antibody (routine testing w rflx)   Hepatitis C antibody   Cervicovaginal ancillary only   BMI 37.0-37.9, adult       Routine screening for STI (sexually transmitted infection)       Relevant Orders   RPR   Hepatitis B surface antigen   HIV Antibody (routine testing w rflx)   Hepatitis C antibody   Cervicovaginal ancillary only   Tattoo of skin       Relevant Orders   Hepatitis C antibody   Urinary incontinence, nocturnal enuresis       Urinary frequency       S/P gastric bypass          Plan:   Pap: Not needed  Labs: See orders   Routine preventative health maintenance measures emphasized: Exercise/Diet/Weight control, Tobacco Warnings,  Alcohol/Substance use risks and Stress Management; see AVS  Encouraged pelvic floor physical therapy, see AVS  Follow up with Urogynecology as scheduled  Reviewed red flag symptoms and when to call  Return to Clinic - 1 Year for [facility] or sooner if needed   Serafina Royals, CNM  [facility], [facility] 05/04/20 11:58 AM

## 2020-05-04 NOTE — Patient Instructions (Addendum)
Preventive Care 21-36 Years Old, Female Preventive care refers to visits with your health care provider and lifestyle choices that can promote health and wellness. This includes:  A yearly physical exam. This may also be called an annual well check.  Regular dental visits and eye exams.  Immunizations.  Screening for certain conditions.  Healthy lifestyle choices, such as eating a healthy diet, getting regular exercise, not using drugs or products that contain nicotine and tobacco, and limiting alcohol use. What can I expect for my preventive care visit? Physical exam Your health care provider will check your:  Height and weight. This may be used to calculate body mass index (BMI), which tells if you are at a healthy weight.  Heart rate and blood pressure.  Skin for abnormal spots. Counseling Your health care provider may ask you questions about your:  Alcohol, tobacco, and drug use.  Emotional well-being.  Home and relationship well-being.  Sexual activity.  Eating habits.  Work and work environment.  Method of birth control.  Menstrual cycle.  Pregnancy history. What immunizations do I need?  Influenza (flu) vaccine  This is recommended every year. Tetanus, diphtheria, and pertussis (Tdap) vaccine  You may need a Td booster every 10 years. Varicella (chickenpox) vaccine  You may need this if you have not been vaccinated. Human papillomavirus (HPV) vaccine  If recommended by your health care provider, you may need three doses over 6 months. Measles, mumps, and rubella (MMR) vaccine  You may need at least one dose of MMR. You may also need a second dose. Meningococcal conjugate (MenACWY) vaccine  One dose is recommended if you are age 19-21 years and a first-year college student living in a residence hall, or if you have one of several medical conditions. You may also need additional booster doses. Pneumococcal conjugate (PCV13) vaccine  You may need  this if you have certain conditions and were not previously vaccinated. Pneumococcal polysaccharide (PPSV23) vaccine  You may need one or two doses if you smoke cigarettes or if you have certain conditions. Hepatitis A vaccine  You may need this if you have certain conditions or if you travel or work in places where you may be exposed to hepatitis A. Hepatitis B vaccine  You may need this if you have certain conditions or if you travel or work in places where you may be exposed to hepatitis B. Haemophilus influenzae type b (Hib) vaccine  You may need this if you have certain conditions. You may receive vaccines as individual doses or as more than one vaccine together in one shot (combination vaccines). Talk with your health care provider about the risks and benefits of combination vaccines. What tests do I need?  Blood tests  Lipid and cholesterol levels. These may be checked every 5 years starting at age 20.  Hepatitis C test.  Hepatitis B test. Screening  Diabetes screening. This is done by checking your blood sugar (glucose) after you have not eaten for a while (fasting).  Sexually transmitted disease (STD) testing.  BRCA-related cancer screening. This may be done if you have a family history of breast, ovarian, tubal, or peritoneal cancers.  Pelvic exam and Pap test. This may be done every 3 years starting at age 21. Starting at age 30, this may be done every 5 years if you have a Pap test in combination with an HPV test. Talk with your health care provider about your test results, treatment options, and if necessary, the need for more tests.   Follow these instructions at home: Eating and drinking   Eat a diet that includes fresh fruits and vegetables, whole grains, lean protein, and low-fat dairy.  Take vitamin and mineral supplements as recommended by your health care provider.  Do not drink alcohol if: ? Your health care provider tells you not to drink. ? You are  pregnant, may be pregnant, or are planning to become pregnant.  If you drink alcohol: ? Limit how much you have to 0-1 drink a day. ? Be aware of how much alcohol is in your drink. In the [place] one drink equals one 12 oz bottle of beer (355 mL), one 5 oz glass of wine (148 mL), or one 1 oz glass of hard liquor (44 mL). Lifestyle  Take daily care of your teeth and gums.  Stay active. Exercise for at least 30 minutes on 5 or more days each week.  Do not use any products that contain nicotine or tobacco, such as cigarettes, e-cigarettes, and chewing tobacco. If you need help quitting, ask your health care provider.  If you are sexually active, practice safe sex. Use a condom or other form of birth control (contraception) in order to prevent pregnancy and STIs (sexually transmitted infections). If you plan to become pregnant, see your health care provider for a preconception visit. What's next?  Visit your health care provider once a year for a well check visit.  Ask your health care provider how often you should have your eyes and teeth checked.  Stay up to date on all vaccines. This information is not intended to replace advice given to you by your health care provider. Make sure you discuss any questions you have with your health care provider. Document Revised: 05/13/2018 Document Reviewed: 05/13/2018 Elsevier Patient Education  [place].   Urinary Frequency, Adult Urinary frequency means urinating more often than usual. You may urinate every 1-2 hours even though you drink a normal amount of fluid and do not have a bladder infection or condition. Although you urinate more often than normal, the total amount of urine produced in a day is normal. With urinary frequency, you may have an urgent need to urinate often. The stress and anxiety of needing to find a bathroom quickly can make this urge worse. This condition may go away on its own or you may need treatment at home. Home  treatment may include bladder training, exercises, taking medicines, or making changes to your diet. Follow these instructions at home: Bladder health   Keep a bladder diary if told by your health care provider. Keep track of: ? What you eat and drink. ? How often you urinate. ? How much you urinate.  Follow a bladder training program if told by your health care provider. This may include: ? Learning to delay going to the bathroom. ? Double urinating (voiding). This helps if you are not completely emptying your bladder. ? Scheduled voiding.  Do Kegel exercises as told by your health care provider. Kegel exercises strengthen the muscles that help control urination, which may help the condition. Eating and drinking  If told by your health care provider, make diet changes, such as: ? Avoiding caffeine. ? Drinking fewer fluids, especially alcohol. ? Not drinking in the evening. ? Avoiding foods or drinks that may irritate the bladder. These include coffee, tea, soda, artificial sweeteners, citrus, tomato-based foods, and chocolate. ? Eating foods that help prevent or ease constipation. Constipation can make this condition worse. Your health care provider may  recommend that you:  Drink enough fluid to keep your urine pale yellow.  Take over-the-counter or prescription medicines.  Eat foods that are high in fiber, such as beans, whole grains, and fresh fruits and vegetables.  Limit foods that are high in fat and processed sugars, such as fried or sweet foods. General instructions  Take over-the-counter and prescription medicines only as told by your health care provider.  Keep all follow-up visits as told by your health care provider. This is important. Contact a health care provider if:  You start urinating more often.  You feel pain or irritation when you urinate.  You notice blood in your urine.  Your urine looks cloudy.  You develop a fever.  You begin vomiting. Get  help right away if:  You are unable to urinate. Summary  Urinary frequency means urinating more often than usual. With urinary frequency, you may urinate every 1-2 hours even though you drink a normal amount of fluid and do not have a bladder infection or other bladder condition.  Your health care provider may recommend that you keep a bladder diary, follow a bladder training program, or make dietary changes.  If told by your health care provider, do Kegel exercises to strengthen the muscles that help control urination.  Take over-the-counter and prescription medicines only as told by your health care provider.  Contact a health care provider if your symptoms do not improve or get worse. This information is not intended to replace advice given to you by your health care provider. Make sure you discuss any questions you have with your health care provider. Document Revised: 03/11/2018 Document Reviewed: 03/11/2018 Elsevier Patient Education  [place].   Pelvic Floor Dysfunction  Pelvic floor dysfunction (PFD) is a condition that results when the group of muscles and connective tissues that support the organs in the pelvis (pelvic floor muscles) do not work well. These muscles and their connections form a sling that supports the colon and bladder. In men, these muscles also support the prostate gland. In women, they also support the uterus. PFD causes pelvic floor muscles to be too weak, too tight, or a combination of both. In PFD, muscle movements are not coordinated. This condition may cause bowel or bladder problems. It may also cause pain. What are the causes? This condition may be caused by an injury to the pelvic area or by a weakening of pelvic muscles. This often results from pregnancy and childbirth or other types of strain. In many cases, the exact cause is not known. What increases the risk? The following factors may make you more likely to develop this  condition:  Having a condition of chronic bladder tissue inflammation (interstitial cystitis).  Being an older person.  Being overweight.  Radiation treatment for cancer in the pelvic region.  Previous pelvic surgery, such as removal of the uterus (hysterectomy) or prostate gland (prostatectomy). What are the signs or symptoms? Symptoms of this condition vary and may include:  Bladder symptoms, such as: ? Trouble starting urination and emptying the bladder. ? Frequent urinary tract infections. ? Leaking urine when coughing, laughing, or exercising (stress incontinence). ? Having to pass urine urgently or frequently. ? Pain when passing urine.  Bowel symptoms, such as: ? Constipation. ? Urgent or frequent bowel movements. ? Incomplete bowel movements. ? Painful bowel movements. ? Leaking stool or gas.  Unexplained genital or rectal pain.  Genital or rectal muscle spasms.  Low back pain. In women, symptoms of PFD may  also include:  A heavy, full, or aching feeling in the vagina.  A bulge that protrudes into the vagina.  Pain during or after sexual intercourse. How is this diagnosed? This condition may be diagnosed based on:  Your symptoms and medical history.  A physical exam. During the exam, your health care provider may check your pelvic muscles for tightness, spasm, pain, or weakness. This may include a rectal exam and a pelvic exam for women. In some cases, you may have diagnostic tests, such as:  Electrical muscle function tests.  Urine flow testing.  X-ray tests of bowel function.  Ultrasound of the pelvic organs. How is this treated? Treatment for this condition depends on your symptoms. Treatment options include:  Physical therapy. This may include Kegel exercises to help relax or strengthen the pelvic floor muscles.  Biofeedback. This type of therapy provides feedback on how tight your pelvic floor muscles are so that you can learn to control  them.  Internal or external massage therapy.  A treatment that involves electrical stimulation of the pelvic floor muscles to help control pain (transcutaneous electrical nerve stimulation, or TENS).  Sound wave therapy (ultrasound) to reduce muscle spasms.  Medicines, such as: ? Muscle relaxants. ? Bladder control medicines. Surgery to reconstruct or support pelvic floor muscles may be an option if other treatments do not help. Follow these instructions at home: Activity  Do your usual activities as told by your health care provider. Ask your health care provider if you should modify any activities.  Do pelvic floor strengthening or relaxing exercises at home as told by your physical therapist. Lifestyle  Maintain a healthy weight.  Eat foods that are high in fiber, such as beans, whole grains, and fresh fruits and vegetables.  Limit foods that are high in fat and processed sugars, such as fried or sweet foods.  Manage stress with relaxation techniques such as yoga or meditation. General instructions  If you have problems with leakage: ? Use absorbable pads or wear padded underwear. ? Wash frequently with mild soap. ? Keep your genital and anal area as clean and dry as possible. ? Ask your health care provider if you should try a barrier cream to prevent skin irritation.  Take warm baths to relieve pelvic muscle tension or spasms.  Take over-the-counter and prescription medicines only as told by your health care provider.  Keep all follow-up visits as told by your health care provider. This is important. Contact a health care provider if you:  Are not improving with home care.  Have signs or symptoms of PFD that get worse at home.  Develop new signs or symptoms at home.  Have signs of a urinary tract infection, such as: ? Fever. ? Chills. ? Urinary frequency. ? A burning feeling when urinating.  Have not had a bowel movement in 3 days  (constipation). Summary  Pelvic floor dysfunction results when the muscles and connective tissues in your pelvic floor do not work well.  These muscles and their connections form a sling that supports your colon and bladder. In men, these muscles also support the prostate gland. In women, they also support the uterus.  PFD may be caused by an injury to the pelvic area or by a weakening of pelvic muscles.  PFD causes pelvic floor muscles to be too weak, too tight, or a combination of both. Symptoms may vary from person to person.  In most cases, PFD can be treated with physical therapies and medicines. Surgery may  be an option if other treatments do not help. This information is not intended to replace advice given to you by your health care provider. Make sure you discuss any questions you have with your health care provider. Document Revised: 03/22/2018 Document Reviewed: 03/22/2018 Elsevier Patient Education  [place].

## 2020-05-05 LAB — HIV ANTIBODY (ROUTINE TESTING W REFLEX): HIV Screen 4th Generation wRfx: NONREACTIVE

## 2020-05-05 LAB — HEPATITIS B SURFACE ANTIGEN: Hepatitis B Surface Ag: NEGATIVE

## 2020-05-05 LAB — HEPATITIS C ANTIBODY: Hep C Virus Ab: <0.1 s/co ratio (ref 0.0–0.9)

## 2020-05-05 LAB — RPR: RPR Ser Ql: NONREACTIVE

## 2020-05-11 LAB — CERVICOVAGINAL ANCILLARY ONLY
Chlamydia: NEGATIVE
Comment: NEGATIVE
Comment: NEGATIVE
Comment: NEGATIVE
Comment: NORMAL
HSV1: NEGATIVE
HSV2: NEGATIVE
Neisseria Gonorrhea: NEGATIVE
Trichomonas: NEGATIVE

## 2020-10-20 DIAGNOSIS — R2689 Other abnormalities of gait and mobility: Secondary | ICD-10-CM | POA: Insufficient documentation

## 2020-10-20 DIAGNOSIS — M545 Low back pain, unspecified: Secondary | ICD-10-CM | POA: Insufficient documentation

## 2020-10-20 DIAGNOSIS — R200 Anesthesia of skin: Secondary | ICD-10-CM | POA: Insufficient documentation

## 2020-10-20 DIAGNOSIS — G8929 Other chronic pain: Secondary | ICD-10-CM | POA: Insufficient documentation

## 2020-10-20 DIAGNOSIS — R262 Difficulty in walking, not elsewhere classified: Secondary | ICD-10-CM | POA: Insufficient documentation

## 2020-10-20 DIAGNOSIS — R202 Paresthesia of skin: Secondary | ICD-10-CM | POA: Insufficient documentation

## 2020-11-26 NOTE — Progress Notes (Signed)
[facility]  [address] [place], [place] [address] Phone: [phone]  Fax: [phone]   Clinic Day:  11/27/2020  Referring physician: Rayetta Humphrey, MD  Chief Complaint: Karen Schroeder is a 35 y.o. female s/p gastric bypass with B12 deficiency who is referred in consultation by Dr. Angus Palms for assessment and management.   HPI: In 2016/2017, the patient developed numbness and tingling in her feet. She was found to be vitamin B12 and D deficient and was put on supplementation. Her symptoms resolved. After a while, she stopped taking the supplements. Her symptoms stayed away and her levels stayed normal.  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 She took a vitamin B12 complex for 2 weeks after surgery. The patient notes that she was anemic before her gastric bypass surgery. She has never received IV iron or vitamin B12 injections. Since the surgery, the symptoms in her legs have returned. She now has tingling and numbness all the way up to mid thigh. She also has associated painful leg spasms.  The patient saw Dr. Malvin Johns on 10/19/2020 for new patient assessment.  She was noted to have numbness tingling low back pain, balance issues and ulnar neuropathy.  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  She was started on gabapentin 100 mg BID, which was later increased to 200 mg BID. The gabapentin has not helped.  Labs followed: 01/30/2016:  Hematocrit 43.0, hemoglobin 13.9, platelets 233,000, WBC 6,900. 05/10/2019:  Hematocrit 43.0, hemoglobin 14.1, platelets 261,000, WBC 8,700. Iron saturation 14%. TIBC 462. B12    355. Folate >24.8. Ferritin 11.5. 08/04/2019:  Hematocrit 45.0, hemoglobin 15.0, platelets 228,000, WBC 9,400. 11/19/2019:  Hematocrit 43.0, hemoglobin 14.3, platelets 158,000, WBC 6,800. Iron saturation 18%. TIBC 347. B12    673. Folate  >24.8. 02/17/2020:  Hematocrit 42.0, hemoglobin 13.8, platelets 192,000, WBC 8,100. Iron saturation 14%. TIBC 347. B12    717. Folate >24.8. 08/21/2020:  Hematocrit 42.0, hemoglobin 13.8, platelets 194,000, WBC 5,900. Iron saturation 23%. TIBC 329. B12 1,140. Folate >24.8. 11/15/2020:  Hematocrit 41.0, hemoglobin 13.7, platelets 199,000, WBC 6,200. Iron saturation 21%. TIBC 341. B12    506. Folate >24.8.   CMP on 11/15/2020 was normal except for a total protein of 6.1.  Symptomatically, the patient reports multiple concerns. 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 She has had a rash on her forehead for 2-3 months which she attributes to a new face product she was using.  For a few days recently, the patient saw a bright light in the middle of her left eye. She saw her eye doctor, who felt that it was due to a migraine. This has resolved. She wears contacts and notes that her depth perception is "off."  The patient has IBS which gives her gas and diarrhea. She takes Gas X and follows a low FODMAP diet. She takes 4 chewable bariatric vitamins, but the rest of her nutrients come from food. She notes that a lot of vegetables rich in vitamin B12 trigger her IBS.  The patient denies fevers, sweats, headaches, sore throat, cough, shortness of breath, chest pain, palpitations, nausea, vomiting, reflux, weakness, and bleeding of any kind.  Her mother had uterine cancer and a hysterectomy. Her maternal grandmother had liver cancer and her  paternal grandmother throat cancer. Her grandfather had prostate cancer. Her great uncle had leukemia.   Past Medical History:  Diagnosis Date  . Allergy   . IBS (irritable bowel syndrome)   . Neuropathy   . Obesity   . Vitamin D deficiency     Past Surgical History:  Procedure Laterality  Date  . ACNE CYST REMOVAL     on back  . CHOLECYSTECTOMY    . GASTRIC BYPASS  08/29/2019    Family History  Problem Relation Age of Onset  . Uterine cancer Mother 106  . Pancreatic cancer Maternal Grandmother   . Liver cancer Maternal Grandmother   . Throat cancer Paternal Grandmother   . Prostate cancer Maternal Grandfather   . Breast cancer Cousin   . Sudden Cardiac Death Father   . Ovarian cancer Neg Hx   . Colon cancer Neg Hx     Social History:  reports that she quit smoking about 13 years ago. She has never used smokeless tobacco. She reports previous alcohol use. She reports that she does not use drugs. She denies alcohol and tobacco use. She denies exposure to radiation or toxins, including heavy metals. 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 The patient is alone today.  Allergies:  Allergies  Allergen Reactions  . Gramineae Pollens Hives    MULBERRY TREES-SNEEZING, SKIN TEST HIVES  . Lactose Other (See Comments)    IBS    Current Medications: Current Outpatient Medications  Medication Sig Dispense Refill  . azelastine (ASTELIN) 0.1 % nasal spray Place into the nose.    . gabapentin (NEURONTIN) 100 MG capsule     . Multiple Vitamins-Minerals (BARIATRIC MULTIVITAMINS/IRON PO) Take by mouth in the morning and at bedtime.     No current facility-administered medications for this visit.    Review of Systems  Constitutional: Positive for malaise/fatigue. Negative for chills, diaphoresis, fever and weight loss.  HENT: Negative for congestion, ear discharge, ear pain, hearing loss, nosebleeds, sinus pain, sore throat and tinnitus.   Eyes: Negative for blurred vision.       Dry eyes. Poor depth perception.  Respiratory: Negative for cough, hemoptysis, sputum production and shortness of breath.   Cardiovascular: Negative for chest pain, palpitations and leg swelling.  Gastrointestinal: Positive  for diarrhea. Negative for abdominal pain, blood in stool, constipation, heartburn, melena, nausea and vomiting.       IBS. Gas.  Genitourinary: Positive for urgency. Negative for dysuria, frequency and hematuria.  Musculoskeletal: Positive for joint pain (arthritis in knees and ankles) and myalgias (leg pain and spasms). Negative for back pain and neck pain.  Skin: Positive for rash (on forehead x 2-3 months). Negative for itching.  Neurological: Positive for sensory change (tingling and numbness from feet to mid thigh). Negative for dizziness, weakness and headaches.       Face twitching. Poor balance. Occasional left foot drop.  Endo/Heme/Allergies: Positive for environmental allergies (runny nose). Does not bruise/bleed easily.       Face flushing  Psychiatric/Behavioral: Negative for depression and memory loss. The patient is not nervous/anxious and does not have insomnia.   All other systems reviewed and are negative.  Performance status (ECOG): 1  Vitals Temperature (!) 97.3 F (36.3 C), temperature source Tympanic, resp. rate 20, height 5\' 7"  (1.702 m), weight 196 lb 3.4 oz (89 kg).   Physical Exam Vitals and nursing note reviewed.  Constitutional:      General:  She is not in acute distress.    Appearance: She is not diaphoretic.  HENT:     Head: Normocephalic and atraumatic.     Mouth/Throat:     Mouth: Mucous membranes are moist.     Pharynx: Oropharynx is clear.  Eyes:     General: No scleral icterus.    Extraocular Movements: Extraocular movements intact.     Conjunctiva/sclera: Conjunctivae normal.     Pupils: Pupils are equal, round, and reactive to light.     Comments: Contacts.  Cardiovascular:     Rate and Rhythm: Normal rate and regular rhythm.     Heart sounds: Normal heart sounds. No murmur heard.   Pulmonary:     Effort: Pulmonary effort is normal. No respiratory distress.     Breath sounds: Normal breath sounds. No wheezing or rales.  Chest:     Chest  wall: No tenderness.  Breasts:     Right: No axillary adenopathy or supraclavicular adenopathy.     Left: No axillary adenopathy or supraclavicular adenopathy.    Abdominal:     General: Bowel sounds are normal. There is no distension.     Palpations: Abdomen is soft. There is no mass.     Tenderness: There is no abdominal tenderness. There is no guarding or rebound.  Musculoskeletal:        General: No swelling or tenderness. Normal range of motion.     Cervical back: Normal range of motion and neck supple.  Lymphadenopathy:     Head:     Right side of head: No preauricular, posterior auricular or occipital adenopathy.     Left side of head: No preauricular, posterior auricular or occipital adenopathy.     Cervical: No cervical adenopathy.     Upper Body:     Right upper body: No supraclavicular or axillary adenopathy.     Left upper body: No supraclavicular or axillary adenopathy.     Lower Body: No right inguinal adenopathy. No left inguinal adenopathy.  Skin:    General: Skin is warm and dry.  Neurological:     Mental Status: She is alert and oriented to person, place, and time.  Psychiatric:        Behavior: Behavior normal.        Thought Content: Thought content normal.        Judgment: Judgment normal.    No visits with results within 3 Day(s) from this visit.  Latest known visit with results is:  Office Visit on 05/04/2020  Component Date Value Ref Range Status  . RPR Ser Ql 05/04/2020 Non Reactive  Non Reactive Final  . Hepatitis B Surface Ag 05/04/2020 Negative  Negative Final  . HIV Screen 4th Generation wRfx 05/04/2020 Non Reactive  Non Reactive Final  . Hep C Virus Ab 05/04/2020 <0.1  0.0 - 0.9 s/co ratio Final   Comment:                                   Negative:     < 0.8                              Indeterminate: 0.8 - 0.9  Positive:     > 0.9  The CDC recommends that a positive HCV antibody result  be followed up with a  HCV Nucleic Acid Amplification  test (509326).   . Neisseria Gonorrhea 05/04/2020 Negative   Final  . Chlamydia 05/04/2020 Negative   Final  . Trichomonas 05/04/2020 Negative   Final  . HSV1 05/04/2020 Negative   Final  . HSV2 05/04/2020 Negative   Final  . Comment 05/04/2020 Normal Reference Range Trichomonas - Negative   Final  . Comment 05/04/2020 Normal Reference Ranger Chlamydia - Negative   Final  . Comment 05/04/2020 Normal Reference Range Neisseria Gonorrhea - Negative   Final  . Comment 05/04/2020 Normal Reference Range HSV - Negative   Final    Assessment:  Karen Schroeder is a 35 y.o. female s/p gastric bypass (12/14/20202) and subsequent B12 deficiency.  She is on bariatric multivitamins.  CBC on 11/15/2020 revealed a hematocrit of 41.0, hemoglobin 13.7, platelets 199,000, WBC 6,200. Iron saturation was 21% with a TIBC 341. B12 was 506.  Folate >24.8 on 11/14/2020.  Methylmalonic acid (MMA) was 123 (normal)  Symptomatically, she has multiple concerns. She describes progressive numbness and tingling up to her mid thigh.  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 She has had a rash on her forehead for 2-3 months.  She has IBS which gives her gas and diarrhea.  Exam reveals no adenopathy or hepatosplenomegaly.  Plan: 1.   Labs today:  CBC with diff, ferritin, iron studies, B12, anti-parietal cell bodies, intrinsic factor antibodies.  2.   B12 deficiency  Patient is s/p gastric bypass surgery.  B12 level appears normal on supplementation. 3.   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  Discuss additional testing to r/o monoclonal gammopathy/POEMS.    SPEP, FLCA, VEGF. 4.   RTC in 1 week for MD assessment and review of work-up and discussion regarding direction of therapy.  I  discussed the assessment and treatment plan with the patient.  The patient was provided an opportunity to ask questions and all were answered.  The patient agreed with the plan and demonstrated an understanding of the instructions.  The patient was advised to call back if the symptoms worsen or if the condition fails to improve as anticipated.  I provided 32 minutes of face-to-face time during this this encounter and > 50% was spent counseling as documented under my assessment and plan. An additional 15 minutes were spent reviewing her chart (Epic and Care Everywhere) including notes, labs, and imaging studies.    Deonne Rooks C. Merlene Pulling, MD, PhD    11/27/2020, 3:13 PM  I, Danella Penton Tufford, am acting as Neurosurgeon for [employer]. Merlene Pulling, MD, PhD.  I, Damian Buckles C. Merlene Pulling, MD, have reviewed the above documentation for accuracy and completeness, and I agree with the above.

## 2020-11-27 ENCOUNTER — Other Ambulatory Visit: Payer: Self-pay | Admitting: *Deleted

## 2020-11-27 ENCOUNTER — Other Ambulatory Visit: Payer: Self-pay

## 2020-11-27 ENCOUNTER — Inpatient Hospital Stay: Payer: BC Managed Care – PPO

## 2020-11-27 ENCOUNTER — Inpatient Hospital Stay: Payer: BC Managed Care – PPO | Attending: Hematology and Oncology | Admitting: Hematology and Oncology

## 2020-11-27 ENCOUNTER — Encounter: Payer: Self-pay | Admitting: Hematology and Oncology

## 2020-11-27 VITALS — Temp 97.3°F | Resp 20 | Ht 67.0 in | Wt 196.2 lb

## 2020-11-27 DIAGNOSIS — R252 Cramp and spasm: Secondary | ICD-10-CM | POA: Insufficient documentation

## 2020-11-27 DIAGNOSIS — E538 Deficiency of other specified B group vitamins: Secondary | ICD-10-CM | POA: Diagnosis not present

## 2020-11-27 DIAGNOSIS — K580 Irritable bowel syndrome with diarrhea: Secondary | ICD-10-CM | POA: Diagnosis not present

## 2020-11-27 DIAGNOSIS — Z9884 Bariatric surgery status: Secondary | ICD-10-CM | POA: Diagnosis not present

## 2020-11-27 DIAGNOSIS — G629 Polyneuropathy, unspecified: Secondary | ICD-10-CM | POA: Diagnosis not present

## 2020-11-27 DIAGNOSIS — R5383 Other fatigue: Secondary | ICD-10-CM | POA: Insufficient documentation

## 2020-11-27 DIAGNOSIS — G6289 Other specified polyneuropathies: Secondary | ICD-10-CM

## 2020-11-27 DIAGNOSIS — Z87891 Personal history of nicotine dependence: Secondary | ICD-10-CM | POA: Insufficient documentation

## 2020-11-27 DIAGNOSIS — R3915 Urgency of urination: Secondary | ICD-10-CM | POA: Diagnosis not present

## 2020-11-27 NOTE — Progress Notes (Signed)
Patient here today for initial evaluation regarding vitamin B deficiency. Patient reports that she has been dealing with vitamin b12 and vitamin d deficiency since gastric bypass surgery. She reports ongoing fatigue, numbness and tingling in feet and legs that is worsening. She also is having issues with balance. Patient has tried oral b12 supplement without any change.

## 2020-11-28 ENCOUNTER — Inpatient Hospital Stay: Payer: BC Managed Care – PPO

## 2020-11-28 DIAGNOSIS — E538 Deficiency of other specified B group vitamins: Secondary | ICD-10-CM | POA: Diagnosis not present

## 2020-11-28 DIAGNOSIS — G6289 Other specified polyneuropathies: Secondary | ICD-10-CM

## 2020-11-28 LAB — COMPREHENSIVE METABOLIC PANEL
ALT: 16 U/L (ref 0–44)
AST: 23 U/L (ref 15–41)
Albumin: 4.3 g/dL (ref 3.5–5.0)
Alkaline Phosphatase: 88 U/L (ref 38–126)
Anion gap: 11 (ref 5–15)
BUN: 23 mg/dL — ABNORMAL HIGH (ref 6–20)
CO2: 22 mmol/L (ref 22–32)
Calcium: 9.6 mg/dL (ref 8.9–10.3)
Chloride: 104 mmol/L (ref 98–111)
Creatinine, Ser: 0.7 mg/dL (ref 0.44–1.00)
GFR, Estimated: >60 mL/min (ref >60)
Glucose, Bld: 79 mg/dL (ref 70–99)
Potassium: 3.6 mmol/L (ref 3.5–5.1)
Sodium: 137 mmol/L (ref 135–145)
Total Bilirubin: 1.2 mg/dL (ref 0.3–1.2)
Total Protein: 7.4 g/dL (ref 6.5–8.1)

## 2020-11-28 LAB — FERRITIN: Ferritin: 51 ng/mL (ref 11–307)

## 2020-11-28 LAB — CBC WITH DIFFERENTIAL/PLATELET
Abs Immature Granulocytes: 0.02 10*3/uL (ref 0.00–0.07)
Basophils Absolute: 0.1 10*3/uL (ref 0.0–0.1)
Basophils Relative: 1 %
Eosinophils Absolute: 0.1 10*3/uL (ref 0.0–0.5)
Eosinophils Relative: 1 %
HCT: 41.6 % (ref 36.0–46.0)
Hemoglobin: 13.7 g/dL (ref 12.0–15.0)
Immature Granulocytes: 0 %
Lymphocytes Relative: 29 %
Lymphs Abs: 2 10*3/uL (ref 0.7–4.0)
MCH: 29.3 pg (ref 26.0–34.0)
MCHC: 32.9 g/dL (ref 30.0–36.0)
MCV: 89.1 fL (ref 80.0–100.0)
Monocytes Absolute: 0.4 10*3/uL (ref 0.1–1.0)
Monocytes Relative: 6 %
Neutro Abs: 4.2 10*3/uL (ref 1.7–7.7)
Neutrophils Relative %: 63 %
Platelets: 186 10*3/uL (ref 150–400)
RBC: 4.67 MIL/uL (ref 3.87–5.11)
RDW: 13.5 % (ref 11.5–15.5)
WBC: 6.7 10*3/uL (ref 4.0–10.5)
nRBC: 0 % (ref 0.0–0.2)

## 2020-11-28 LAB — VITAMIN B12: Vitamin B-12: 515 pg/mL (ref 180–914)

## 2020-11-29 LAB — KAPPA/LAMBDA LIGHT CHAINS
Kappa free light chain: 21.5 mg/L — ABNORMAL HIGH (ref 3.3–19.4)
Kappa, lambda light chain ratio: 1.42 (ref 0.26–1.65)
Lambda free light chains: 15.1 mg/L (ref 5.7–26.3)

## 2020-11-29 LAB — INTRINSIC FACTOR ANTIBODIES: Intrinsic Factor: 1.1 AU/mL (ref 0.0–1.1)

## 2020-12-01 LAB — MULTIPLE MYELOMA PANEL, SERUM
Albumin SerPl Elph-Mcnc: 3.8 g/dL (ref 2.9–4.4)
Albumin/Glob SerPl: 1.4 (ref 0.7–1.7)
Alpha 1: 0.3 g/dL (ref 0.0–0.4)
Alpha2 Glob SerPl Elph-Mcnc: 0.6 g/dL (ref 0.4–1.0)
B-Globulin SerPl Elph-Mcnc: 1 g/dL (ref 0.7–1.3)
Gamma Glob SerPl Elph-Mcnc: 0.9 g/dL (ref 0.4–1.8)
Globulin, Total: 2.8 g/dL (ref 2.2–3.9)
IgA: 246 mg/dL (ref 87–352)
IgG (Immunoglobin G), Serum: 947 mg/dL (ref 586–1602)
IgM (Immunoglobulin M), Srm: 100 mg/dL (ref 26–217)
Total Protein ELP: 6.6 g/dL (ref 6.0–8.5)

## 2020-12-02 LAB — ANTI-PARIETAL ANTIBODY: Parietal Cell Antibody-IgG: 3.6 Units (ref 0.0–20.0)

## 2020-12-04 LAB — VASCULAR ENDOTHELIAL GROWTH FACTOR: VEGF, Plasma: 350 pg/mL — ABNORMAL HIGH (ref 0–115)
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
## 2020-12-05 ENCOUNTER — Inpatient Hospital Stay (HOSPITAL_BASED_OUTPATIENT_CLINIC_OR_DEPARTMENT_OTHER): Payer: BC Managed Care – PPO | Admitting: Hematology and Oncology

## 2020-12-05 ENCOUNTER — Encounter: Payer: Self-pay | Admitting: Hematology and Oncology

## 2020-12-05 ENCOUNTER — Other Ambulatory Visit: Payer: Self-pay

## 2020-12-05 VITALS — BP 123/80 | HR 67 | Temp 96.1°F | Resp 16 | Wt 197.1 lb

## 2020-12-05 DIAGNOSIS — R200 Anesthesia of skin: Secondary | ICD-10-CM

## 2020-12-05 DIAGNOSIS — Z9884 Bariatric surgery status: Secondary | ICD-10-CM | POA: Diagnosis not present

## 2020-12-05 DIAGNOSIS — E538 Deficiency of other specified B group vitamins: Secondary | ICD-10-CM | POA: Diagnosis not present

## 2020-12-05 DIAGNOSIS — R202 Paresthesia of skin: Secondary | ICD-10-CM

## 2020-12-05 NOTE — Progress Notes (Signed)
Pt here to talk about results. She has some tingling not painful but noticeable. It gets better with warm weather

## 2020-12-06 ENCOUNTER — Telehealth: Payer: Self-pay | Admitting: Hematology and Oncology

## 2020-12-06 DIAGNOSIS — E538 Deficiency of other specified B group vitamins: Secondary | ICD-10-CM | POA: Diagnosis not present

## 2020-12-06 NOTE — Telephone Encounter (Signed)
Patient called and left voicemail - wants to reschedule appt   Said not available that afternoon only in mornings either April 5-7  8-9:45am is perferred

## 2020-12-07 ENCOUNTER — Other Ambulatory Visit: Payer: Self-pay

## 2020-12-07 DIAGNOSIS — G6289 Other specified polyneuropathies: Secondary | ICD-10-CM

## 2020-12-08 LAB — FREE K+L LT CHAINS,QN,UR
Free Kappa Lt Chains,Ur: 70.96 mg/L (ref 1.17–86.46)
Free Kappa/Lambda Ratio: 15.33 — ABNORMAL HIGH (ref 1.83–14.26)
Free Lambda Lt Chains,Ur: 4.63 mg/L (ref 0.27–15.21)
Total Volume: 600

## 2020-12-09 ENCOUNTER — Encounter: Payer: Self-pay | Admitting: Hematology and Oncology

## 2020-12-10 ENCOUNTER — Telehealth: Payer: Self-pay | Admitting: *Deleted

## 2020-12-10 LAB — UPEP/TP, 24-HR URINE
Albumin, U: 29.2 %
Alpha 1, Urine: 7.5 %
Alpha 2, Urine: 15.3 %
Beta, Urine: 23.9 %
Gamma Globulin, Urine: 24.1 %
Total Protein, Urine-Ur/day: 85 mg/24 hr (ref 30–150)
Total Protein, Urine: 14.1 mg/dL
Total Volume: 600

## 2020-12-10 NOTE — Telephone Encounter (Signed)
Dr. Salena Saner - patient sent the following mychart message- please advise    Vennie Homans [facility] Nurse Good morning, Dr. Merlene Pulling -   I hope this message finds you well! During my last visit you asked if there were any other symptoms that I had not mentioned. This morning I could not recall if I had told you the following:   Part of the reason that I am in Pelvic Floor Therapy is due to some "signal issues" with my bladder and my bowels. After having a UTI, I continued (for several months) to have the symptoms of a UTI, specifically feeling like I needed to urinate when I did not. Another issue has been sexual dysfunction/the inability to achieve an orgasm.   I also have ongoing lower back pain and pain around my neck/shoulder area. The lower back pain is more of a dull ache, which could be posture related. I have previously been told that I carry tension/stress in my jaw, neck, and shoulders, so I am not sure if that pain is connected to my symptoms.   I am not sure if any of this is pertinent to your research or my recent tests, but I did want to mention them. I am also willing to get a skin biopsy if needed.   Thank you,  Chrisandra Carota

## 2020-12-11 ENCOUNTER — Encounter: Payer: Self-pay | Admitting: *Deleted

## 2020-12-11 NOTE — Telephone Encounter (Signed)
Please address mychart msg

## 2020-12-19 ENCOUNTER — Telehealth: Payer: BC Managed Care – PPO | Admitting: Hematology and Oncology

## 2020-12-19 NOTE — Progress Notes (Signed)
[facility]  [address] [place], [place] [address] Phone: [phone]  Fax: [phone]   Telemedicine Office Visit:  12/21/2020  Referring physician: Rayetta Humphrey, MD  I connected with [name] on 12/21/2020 at 9:47 AM by videoconferencing and verified that I was speaking with the correct person using 2 identifiers.  The patient was at work.  I discussed the limitations, risk, security and privacy concerns of performing an evaluation and management service by videoconferencing and the availability of in person appointments.  I also discussed with the patient that there may be a patient responsible charge related to this service.  The patient expressed understanding and agreed to proceed.   Chief Complaint: Karen Schroeder is a 35 y.o. 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  HPI: The patient was last seen in the hematology clinic on 12/05/2020. At that time, pain in her legs had improved since the weather had changed.  She still had pain in her feet.  She was taking gabapentin, but it was not working.  She was not taking any supplemental B12.  Work-up had revealed a normal B12 of 515.  SPEP and free light chain assay ratio were normal.  VEGR was 350 (elevated).  We discussed checking 24 hour urine for a monoclonal protein and free light chains.  24 hour UPEP was normal. Urine kappa free light chains were 70.96, lambda free light chains 4.63, and ratio 15.33 (1.83 - 14.26).  During the interim, she notes less pain.  She has tapered off her gabapentin.  She describes "desensitization" of her thigh and leg.  Both legs are affected.  She continues to have pain and spasms in her feet.  Symptoms appear worse at night.  She also notes pelvic floor issues and problems with bowel and bladder signalling.  She has been seen by a  uro-gynecologist, but has not started the prescribed medication because of concern for side effects.  Patient notes plan for skin or nerve biopsy by neurology.   Past Medical History:  Diagnosis Date  . Allergy   . IBS (irritable bowel syndrome)   . Neuropathy   . Obesity   . Vitamin D deficiency     Past Surgical History:  Procedure Laterality Date  . ACNE CYST REMOVAL     on back  . CHOLECYSTECTOMY    . GASTRIC BYPASS  08/29/2019    Family History  Problem Relation Age of Onset  . Uterine cancer Mother 42  . Pancreatic cancer Maternal Grandmother   . Liver cancer Maternal Grandmother   . Throat cancer Paternal Grandmother   . Prostate cancer Maternal Grandfather   . Breast cancer Cousin   . Sudden Cardiac Death Father   . Ovarian cancer Neg Hx   . Colon cancer Neg Hx     Social History:  reports that she quit smoking about 13 years ago. She has never used smokeless tobacco. She reports previous alcohol use. She reports that she does not use drugs. She denies alcohol and tobacco use. She denies exposure to radiation or toxins, including heavy metals. 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 The patient is alone today.  Participants in the patient's visit and their role in the encounter included the patient and Merleen Nicely, RN today.  The intake visit was provided by  Merleen Nicely, Charity fundraiser.  Allergies:  Allergies  Allergen Reactions  . Gramineae Pollens Hives    MULBERRY TREES-SNEEZING, SKIN TEST HIVES  . Lactose Other (See Comments)    IBS    Current Medications: Current Outpatient Medications  Medication Sig Dispense Refill  . azelastine (ASTELIN) 0.1 % nasal spray Place 1 spray into both nostrils 2 (two) times daily.    . Multiple Vitamins-Minerals (BARIATRIC MULTIVITAMINS/IRON PO) Take 2 Doses by mouth in the morning and at bedtime.    . Simethicone 125 MG CAPS      No current  facility-administered medications for this visit.    Review of Systems  Constitutional: Positive for malaise/fatigue. Negative for chills, fever and weight loss (no new weight).  HENT: Negative for congestion, ear discharge, ear pain, hearing loss, nosebleeds, sinus pain and sore throat.   Eyes: Negative for blurred vision, double vision and pain.  Respiratory: Negative for cough, sputum production and shortness of breath.   Cardiovascular: Negative for chest pain, palpitations and leg swelling.  Gastrointestinal: Negative for abdominal pain, blood in stool, constipation, diarrhea, heartburn, nausea and vomiting.       IBS.  Genitourinary: Negative for dysuria and hematuria.       Pelvic floor issues.  Musculoskeletal: Positive for joint pain (arthritis knees and ankles). Negative for back pain, myalgias and neck pain.       Pain and spasm in feet (mostly at night).  Skin: Negative for rash.  Neurological: Positive for tingling (desensitization of legs). Negative for dizziness, tremors, speech change, focal weakness, seizures, weakness and headaches.  Endo/Heme/Allergies: Positive for environmental allergies (runny nose). Does not bruise/bleed easily.  Psychiatric/Behavioral: Negative for depression. The patient is not nervous/anxious and does not have insomnia.    Performance status (ECOG): 1  Vitals There were no vitals taken for this visit.   Physical Exam Nursing note reviewed.  Constitutional:      General: She is not in acute distress.    Appearance: Normal appearance. She is not ill-appearing or toxic-appearing.  HENT:     Head: Normocephalic and atraumatic.     Comments: Short brown hair with small red curls. Eyes:     General: No scleral icterus.    Conjunctiva/sclera: Conjunctivae normal.     Comments: Brown eyes.  Neurological:     Mental Status: She is alert.  Psychiatric:        Mood and Affect: Mood normal.        Behavior: Behavior normal.        Thought  Content: Thought content normal.        Judgment: Judgment normal.    No visits with results within 3 Day(s) from this visit.  Latest known visit with results is:  Orders Only on 12/07/2020  Component Date Value Ref Range Status  . Free Kappa Lt Chains,Ur 12/06/2020 70.96  1.17 - 86.46 mg/L Final                 **Please note reference interval change**  . Free Lambda Lt Chains,Ur 12/06/2020 4.63  0.27 - 15.21 mg/L Final                 **Please note reference interval change**  . Free Kappa/Lambda Ratio 12/06/2020 15.33* 1.83 - 14.26 Final   Comment: (NOTE)              **Please note reference interval change** Performed At: [facility] [address] [place], [place] [id number] Jolene Schimke MD WU:[id number]   .  Total Volume 12/06/2020 600   Final   Performed at [facility], [address], [place], [place] [address]  . Total Protein, Urine 12/06/2020 14.1  Not Estab. mg/dL Final  . Total Protein, Urine-Ur/day 12/06/2020 85  30 - 150 mg/24 hr Final  . Albumin, U 12/06/2020 29.2  % Final  . Alpha 1, Urine 12/06/2020 7.5  % Final  . Alpha 2, Urine 12/06/2020 15.3  % Final  . Beta, Urine 12/06/2020 23.9  % Final  . Gamma Globulin, Urine 12/06/2020 24.1  % Final  . M-spike, % 12/06/2020 Not Observed  Not Observed % Final  . Please Note: 12/06/2020 Comment   Final   Comment: (NOTE) Protein electrophoresis scan will follow via computer, mail, or courier delivery. Performed At: [facility] [address] [place], [place] [id number] Jolene Schimke MD [id number]   . Total Volume 12/06/2020 600   Final   Performed at [facility], [address] [place] [place], [place] [address]    Assessment:  NIJAH TEJERA is a 35 y.o. female s/p gastric bypass (12/14/20202) and subsequent B12 deficiency.  She is on bariatric multivitamins.  CBC on 11/15/2020 revealed a hematocrit of 41.0, hemoglobin 13.7, platelets 199,000, WBC 6,200. Iron saturation was 21%  with a TIBC 341. B12 was 506.  Folate >24.8 on 11/14/2020.  Methylmalonic acid (MMA) was 123 (normal).  Work-up on 11/27/2020 revealed a hematocrit of 41.6, hemoglobin 13.7, platelets 186,000, WBC 6,700. CMP was normal. Ferritin was 51.  Vitamin B12 was 515. Intrinsic factor was 1.1 (0-1.1). Anti-parietal antibody was 3.6 (0-20). Vascular endothelial growth factor was 350 (0-115).   SPEP revealed no monoclonal protein. Kappa free light chains were 21.5, lambda free light chains 15.1, and ratio 1.42 (0.26-1.65). UPEP and urine light chains were ordered.  Symptomatically, she notes less pain.  She notes "desensitization" of her legs.  She continues to have pain and spasms in her feet.  She notes pelvic floor issues.  Plan: 1.   Review entire work-up. 2.   B12 deficiency, treated  B12 and MMA are normal on supplementation.             Patient is s/p gastric bypass surgery.  No evidence of pernicious anemia.    Continue to monitor. 3.   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             Additional testing r/o monoclonal gammopathy/POEMS.                          No evidence of a monoclonal gammopathy in serum or urine.   Elevated VEGF of unclear significance- neurology contacted. 4.   RTC prn.   I discussed the assessment and treatment plan with the patient.  The patient was provided an opportunity to ask questions and all were answered.  The patient agreed with the plan and demonstrated an understanding of the instructions.  The patient was advised to call back if the symptoms worsen or if the condition fails to improve as anticipated.  I provided 12 minutes (9:47 AM - 9:58 PM) of face-to-face video visit time during this this encounter and > 50% was spent counseling as documented under my assessment and plan.  I provided these services from home. An additional 10 minutes were spent reviewing her chart (Epic  and Care  Everywhere) including notes, labs, and imaging studies.  I spoke with Dr Malvin JohnsPotter regarding her work-up and elevated VEGF.   Brinley Treanor C. Merlene Pullingorcoran, MD, PhD  12/21/2020, 5:40 PM

## 2020-12-21 ENCOUNTER — Other Ambulatory Visit: Payer: Self-pay

## 2020-12-21 ENCOUNTER — Encounter: Payer: Self-pay | Admitting: Hematology and Oncology

## 2020-12-21 ENCOUNTER — Inpatient Hospital Stay: Payer: BC Managed Care – PPO | Attending: Hematology and Oncology | Admitting: Hematology and Oncology

## 2020-12-21 DIAGNOSIS — Z9884 Bariatric surgery status: Secondary | ICD-10-CM | POA: Diagnosis not present

## 2020-12-21 DIAGNOSIS — E538 Deficiency of other specified B group vitamins: Secondary | ICD-10-CM | POA: Diagnosis not present

## 2020-12-21 NOTE — Progress Notes (Signed)
Patient contacted for Mychart visit. No new concerns voiced.  

## 2020-12-24 ENCOUNTER — Encounter: Payer: Self-pay | Admitting: Hematology and Oncology

## 2021-02-18 ENCOUNTER — Ambulatory Visit: Payer: Self-pay

## 2021-05-09 ENCOUNTER — Ambulatory Visit (INDEPENDENT_AMBULATORY_CARE_PROVIDER_SITE_OTHER): Payer: 59 | Admitting: Obstetrics and Gynecology

## 2021-05-09 ENCOUNTER — Other Ambulatory Visit: Payer: Self-pay

## 2021-05-09 ENCOUNTER — Encounter: Payer: BC Managed Care – PPO | Admitting: Certified Nurse Midwife

## 2021-05-09 ENCOUNTER — Other Ambulatory Visit (HOSPITAL_COMMUNITY)
Admission: RE | Admit: 2021-05-09 | Discharge: 2021-05-09 | Disposition: A | Discharge status: Home / Self Care | Payer: BC Managed Care – PPO | Source: Ambulatory Visit | Attending: Obstetrics and Gynecology | Admitting: Obstetrics and Gynecology

## 2021-05-09 VITALS — BP 117/75 | HR 60 | Resp 16 | Ht 67.0 in | Wt 170.9 lb

## 2021-05-09 DIAGNOSIS — E663 Overweight: Secondary | ICD-10-CM

## 2021-05-09 DIAGNOSIS — N3944 Nocturnal enuresis: Secondary | ICD-10-CM

## 2021-05-09 DIAGNOSIS — Z01419 Encounter for gynecological examination (general) (routine) without abnormal findings: Secondary | ICD-10-CM | POA: Diagnosis not present

## 2021-05-09 DIAGNOSIS — Z113 Encounter for screening for infections with a predominantly sexual mode of transmission: Secondary | ICD-10-CM | POA: Diagnosis not present

## 2021-05-09 DIAGNOSIS — Z124 Encounter for screening for malignant neoplasm of cervix: Secondary | ICD-10-CM

## 2021-05-09 DIAGNOSIS — Z9884 Bariatric surgery status: Secondary | ICD-10-CM

## 2021-05-09 NOTE — Progress Notes (Signed)
GYNECOLOGY ANNUAL PHYSICAL EXAM PROGRESS NOTE  Subjective:    Karen Schroeder is a 35 y.o. G0P0000 female who presents for an annual exam. She was previously seen by Serafina Royals, CNM. The patient has no major complaints today. The patient is sexually active. The patient participates in regular exercise: yes. Has the patient ever been transfused or tattooed?: no. The patient reports that there is not domestic violence in her life.   She is still noting some mild issues with urinary frequency and nocturia with bed-wetting. Has improved since initiation of pelvic floor physical therapy last year.  Knows that her significant weight loss (~ 175 lbs) since her bariatric surgery.   Menstrual History: Menarche age: 16 Patient's last menstrual period was 04/27/2021 (exact date). Period Cycle (Days): 26 Period Duration (Days): 6-7 Period Pattern: Regular Menstrual Flow: Moderate Menstrual Control: Maxi pad Menstrual Control Change Freq (Hours): 1.5-2 Dysmenorrhea: (!) Mild Dysmenorrhea Symptoms: Cramping, Throbbing, Diarrhea   Gynecologic History:  Contraception: none History of STI's: Denies Last Pap: 11/17/2017. Results were: normal.  Denies h/o abnormal pap smears.     Upstream - 05/09/21 0839       Pregnancy Intention Screening   Does the patient want to become pregnant in the next year? No    Does the patient's partner want to become pregnant in the next year? No    Would the patient like to discuss contraceptive options today? No      Contraception Wrap Up   Contraception Counseling Provided No              The pregnancy intention screening data noted above was reviewed. Potential methods of contraception were discussed. The patient elected to proceed with No Method - Other Reason.    OB History  Gravida Para Term Preterm AB Living  0 0 0 0 0 0  SAB IAB Ectopic Multiple Live Births  0 0 0 0 0     Past Medical History:  Diagnosis Date   Allergy    IBS  (irritable bowel syndrome)    Neuropathy    Obesity    Vitamin D deficiency     Past Surgical History:  Procedure Laterality Date   ACNE CYST REMOVAL     on back   CHOLECYSTECTOMY     GASTRIC BYPASS  08/29/2019    Family History  Problem Relation Age of Onset   Uterine cancer Mother 31   Pancreatic cancer Maternal Grandmother    Liver cancer Maternal Grandmother    Throat cancer Paternal Grandmother    Prostate cancer Maternal Grandfather    Breast cancer Cousin    Sudden Cardiac Death Father    Ovarian cancer Neg Hx    Colon cancer Neg Hx     Social History   Socioeconomic History   Marital status: Single    Spouse name: Not on file   Number of children: Not on file   Years of education: Not on file   Highest education level: Not on file  Occupational History   Not on file  Tobacco Use   Smoking status: Former    Types: Cigarettes    Quit date: 2009    Years since quitting: 13.6   Smokeless tobacco: Never  Vaping Use   Vaping Use: Never used  Substance and Sexual Activity   Alcohol use: Not Currently   Drug use: No   Sexual activity: Yes    Birth control/protection: None  Other Topics Concern   Not  on file  Social History Narrative   Not on file   Social Determinants of Health   Financial Resource Strain: Not on file  Food Insecurity: Not on file  Transportation Needs: Not on file  Physical Activity: Not on file  Stress: Not on file  Social Connections: Not on file  Intimate Partner Violence: Not on file    Current Outpatient Medications on File Prior to Visit  Medication Sig Dispense Refill   azelastine (ASTELIN) 0.1 % nasal spray Place 1 spray into both nostrils 2 (two) times daily.     Calcium 500 MG tablet      Cyanocobalamin (B-12) 1000 MCG TABS Take by mouth.     dicyclomine (BENTYL) 10 MG capsule Take by mouth.     Multiple Vitamins-Minerals (BARIATRIC MULTIVITAMINS/IRON) CAPS      Probiotic Product (PROBIOTIC DAILY) CAPS      vitamin  B-12 (CYANOCOBALAMIN) 1000 MCG tablet      Vitamin E 400 units TABS      No current facility-administered medications on file prior to visit.    Allergies  Allergen Reactions   Gramineae Pollens Hives    MULBERRY TREES-SNEEZING, SKIN TEST HIVES   Lactose Other (See Comments)    IBS     Review of Systems Constitutional: negative for chills, fatigue, fevers and sweats Eyes: negative for irritation, redness and visual disturbance Ears, nose, mouth, throat, and face: negative for hearing loss, nasal congestion, snoring and tinnitus Respiratory: negative for asthma, cough, sputum Cardiovascular: negative for chest pain, dyspnea, exertional chest pressure/discomfort, irregular heart beat, palpitations and syncope Gastrointestinal: negative for abdominal pain, change in bowel habits, nausea and vomiting Genitourinary: negative for abnormal menstrual periods, genital lesions, sexual problems and vaginal discharge, dysuria.   Integument/breast: negative for breast lump, breast tenderness and nipple discharge Hematologic/lymphatic: negative for bleeding and easy bruising Musculoskeletal:negative for back pain and muscle weakness Neurological: negative for dizziness, headaches, vertigo and weakness Endocrine: negative for diabetic symptoms including polydipsia, polyuria and skin dryness Allergic/Immunologic: negative for hay fever and urticaria      Objective:  Blood pressure 117/75, pulse 60, resp. rate 16, height 5\' 7"  (1.702 m), weight 170 lb 14.4 oz (77.5 kg), last menstrual period 04/27/2021.  Body mass index is 26.77 kg/m.   General Appearance:    Alert, cooperative, no distress, appears stated age, overweight.   Head:    Normocephalic, without obvious abnormality, atraumatic  Eyes:    PERRL, conjunctiva/corneas clear, EOM's intact, both eyes  Ears:    Normal external ear canals, both ears  Nose:   Nares normal, septum midline, mucosa normal, no drainage or sinus tenderness   Throat:   Lips, mucosa, and tongue normal; teeth and gums normal  Neck:   Supple, symmetrical, trachea midline, no adenopathy; thyroid: no enlargement/tenderness/nodules; no carotid bruit or JVD  Back:     Symmetric, no curvature, ROM normal, no CVA tenderness  Lungs:     Clear to auscultation bilaterally, respirations unlabored  Chest Wall:    No tenderness or deformity   Heart:    Regular rate and rhythm, S1 and S2 normal, no murmur, rub or gallop  Breast Exam:    No tenderness, masses, or nipple abnormality  Abdomen:     Soft, non-tender, bowel sounds active all four quadrants, no masses, no organomegaly.    Genitalia:    Pelvic:external genitalia normal, vagina without lesions, discharge, or tenderness, rectovaginal septum  normal. Cervix normal in appearance, no cervical motion tenderness, no adnexal masses  or tenderness.  Uterus normal size, shape, mobile, regular contours, nontender.  Rectal:    Normal external sphincter.  No hemorrhoids appreciated. Internal exam not done.   Extremities:   Extremities normal, atraumatic, no cyanosis or edema  Pulses:   2+ and symmetric all extremities  Skin:   Skin color, texture, turgor normal, no rashes or lesions  Lymph nodes:   Cervical, supraclavicular, and axillary nodes normal  Neurologic:   CNII-XII intact, normal strength, sensation and reflexes throughout   .  Labs:  Lab Results  Component Value Date   WBC 6.7 11/28/2020   HGB 13.7 11/28/2020   HCT 41.6 11/28/2020   MCV 89.1 11/28/2020   PLT 186 11/28/2020    Lab Results  Component Value Date   CREATININE 0.70 11/28/2020   BUN 23 (H) 11/28/2020   NA 137 11/28/2020   K 3.6 11/28/2020   CL 104 11/28/2020   CO2 22 11/28/2020    Lab Results  Component Value Date   ALT 16 11/28/2020   AST 23 11/28/2020   ALKPHOS 88 11/28/2020   BILITOT 1.2 11/28/2020    No results found for: TSH   Assessment:   1. Cervical cancer screening   2. Well woman exam with routine  gynecological exam   3. Screen for STD (sexually transmitted disease)   4. Urinary incontinence, nocturnal enuresis   5. S/P gastric bypass      Plan:  Blood tests: STD screening performed at patient's request. Has had other labs performed by PCP and Gastric surgeon. Breast self exam technique reviewed and patient encouraged to perform self-exam monthly. Contraception: none. Discussed healthy lifestyle modifications. Mammogram  screening to begin at age 59.  Pap smear ordered. COVID vaccination status: has completed Moderna vaccination series and booster.  Urinary incontinence, nocturnal enuresis - has improved with pelvic floor physical therapy. Still noting some residual symptoms. No obvious signs of  bladder prolapse. May consider surgical option with urethral sling or pessary for incontinence. Could also consider medications. Patient still not done with weight loss, so would consider surgical intervention at that time if desired.  Follow up in 1 year for annual exam   Hildred Laser, MD [facility]

## 2021-05-10 LAB — HEPATITIS B SURFACE ANTIBODY,QUALITATIVE: Hep B Surface Ab, Qual: NONREACTIVE

## 2021-05-10 LAB — HIV ANTIBODY (ROUTINE TESTING W REFLEX): HIV Screen 4th Generation wRfx: NONREACTIVE

## 2021-05-10 LAB — RPR: RPR Ser Ql: NONREACTIVE

## 2021-05-12 ENCOUNTER — Encounter: Payer: Self-pay | Admitting: Obstetrics and Gynecology

## 2021-05-14 LAB — CYTOLOGY - PAP
Chlamydia: NEGATIVE
Comment: NEGATIVE
Comment: NEGATIVE
Comment: NEGATIVE
Comment: NORMAL
Diagnosis: NEGATIVE
High risk HPV: NEGATIVE
Neisseria Gonorrhea: NEGATIVE
Trichomonas: NEGATIVE

## 2021-05-21 NOTE — Addendum Note (Signed)
Addended by: Fabian November on: 05/21/2021 10:13 PM   Modules accepted: Level of Service

## 2022-04-16 ENCOUNTER — Other Ambulatory Visit: Payer: Self-pay | Admitting: Nurse Practitioner

## 2022-04-16 DIAGNOSIS — R1084 Generalized abdominal pain: Secondary | ICD-10-CM

## 2022-04-16 DIAGNOSIS — Z9884 Bariatric surgery status: Secondary | ICD-10-CM

## 2022-04-16 DIAGNOSIS — R198 Other specified symptoms and signs involving the digestive system and abdomen: Secondary | ICD-10-CM

## 2022-04-22 ENCOUNTER — Ambulatory Visit
Admission: RE | Admit: 2022-04-22 | Discharge: 2022-04-22 | Disposition: A | Discharge status: Home / Self Care | Payer: 59 | Source: Ambulatory Visit | Attending: Nurse Practitioner | Admitting: Nurse Practitioner

## 2022-04-22 DIAGNOSIS — Z9884 Bariatric surgery status: Secondary | ICD-10-CM | POA: Insufficient documentation

## 2022-04-22 DIAGNOSIS — R198 Other specified symptoms and signs involving the digestive system and abdomen: Secondary | ICD-10-CM | POA: Insufficient documentation

## 2022-04-22 DIAGNOSIS — R1084 Generalized abdominal pain: Secondary | ICD-10-CM | POA: Diagnosis present

## 2022-04-22 MED ORDER — IOHEXOL 300 MG/ML  SOLN
100.0000 mL | Freq: Once | INTRAMUSCULAR | Status: AC | PRN
  Administered 2022-04-22: 100 mL via INTRAVENOUS

## 2022-05-13 ENCOUNTER — Encounter: Payer: 59 | Admitting: Obstetrics and Gynecology

## 2022-07-07 NOTE — Progress Notes (Unsigned)
GYNECOLOGY ANNUAL PHYSICAL EXAM PROGRESS NOTE  Subjective:    Karen Schroeder is a 36 y.o. G0P0000 female who presents for an annual exam.  The patient is sexually active. The patient participates in regular exercise:  no, due to big toe injury . Has the patient ever been transfused or tattooed?: yes, tattoos. The patient reports that there is not domestic violence in her life.    The patient has the following complaints today.  She has concerns with her irregular menstrual cycle. She has not had her cycle in 2 months.  She also has some concerns with decreased libido. She was evaluated for this in the past, with negative workup.  Has also been seeing pelvic floor physical therapy for the past year due to urinary frequency and nocturia. Was initially helping symptoms but now at a stand still. Is even now trying accupuncture to help with symptoms.    Patient also reports having GI issues (food intolerance).  Has been diagnosed with Alpha Gal allergy.  Undergone full workup, including colonoscopy and allergy testing. Is on FODMAP diet with mild improvement in symptoms. Also experiencing numbness/tingling in her hands and feet, which moves upwards. Has been seen by Rheumatologist and Neurologist.    Menstrual History: Menarche age: 17 Patient's last menstrual period was 05/10/2022 (exact date).   Period Duration (Days): 6 Period Pattern: (!) Irregular Menstrual Flow: Moderate Menstrual Control: Hospital pad Menstrual Control Change Freq (Hours): 1-2 Dysmenorrhea: (!) Mild Dysmenorrhea Symptoms: Cramping, Diarrhea   Gynecologic History:  Contraception: none History of STI's: Denies Last Pap: 05/09/2021. Results were: normal.  Notes h/o abnormal pap smears.   OB History  Gravida Para Term Preterm AB Living  0 0 0 0 0 0  SAB IAB Ectopic Multiple Live Births  0 0 0 0 0    Past Medical History:  Diagnosis Date   Allergy    IBS (irritable bowel syndrome)    Neuropathy     Obesity    Vitamin D deficiency     Past Surgical History:  Procedure Laterality Date   ACNE CYST REMOVAL     on back   CHOLECYSTECTOMY     GASTRIC BYPASS  08/29/2019    Family History  Problem Relation Age of Onset   Uterine cancer Mother 74   Pancreatic cancer Maternal Grandmother    Liver cancer Maternal Grandmother    Throat cancer Paternal Grandmother    Prostate cancer Maternal Grandfather    Breast cancer Cousin    Sudden Cardiac Death Father    Ovarian cancer Neg Hx    Colon cancer Neg Hx     Social History   Socioeconomic History   Marital status: Married    Spouse name: Not on file   Number of children: Not on file   Years of education: Not on file   Highest education level: Not on file  Occupational History   Not on file  Tobacco Use   Smoking status: Former    Types: Cigarettes    Quit date: 2009    Years since quitting: 14.8   Smokeless tobacco: Never  Vaping Use   Vaping Use: Never used  Substance and Sexual Activity   Alcohol use: Not Currently   Drug use: No   Sexual activity: Yes    Birth control/protection: None  Other Topics Concern   Not on file  Social History Narrative   Not on file   Social Determinants of Health   Financial Resource Strain:  Not on file  Food Insecurity: Not on file  Transportation Needs: Not on file  Physical Activity: Not on file  Stress: Not on file  Social Connections: Not on file  Intimate Partner Violence: Not on file    Current Outpatient Medications on File Prior to Visit  Medication Sig Dispense Refill   azelastine (ASTELIN) 0.1 % nasal spray Place 1 spray into both nostrils 2 (two) times daily.     Calcium 500 MG tablet      dicyclomine (BENTYL) 10 MG capsule Take by mouth.     Multiple Vitamins-Minerals (BARIATRIC MULTIVITAMINS/IRON) CAPS      No current facility-administered medications on file prior to visit.    Allergies  Allergen Reactions   Gramineae Pollens Hives    MULBERRY  TREES-SNEEZING, SKIN TEST HIVES   Lactose Other (See Comments)    IBS     Review of Systems Constitutional: negative for chills, fatigue, fevers and sweats Eyes: negative for irritation, redness and visual disturbance Ears, nose, mouth, throat, and face: negative for hearing loss, nasal congestion, snoring and tinnitus Respiratory: negative for asthma, cough, sputum Cardiovascular: negative for chest pain, dyspnea, exertional chest pressure/discomfort, irregular heart beat, palpitations and syncope Gastrointestinal: negative for abdominal pain, change in bowel habits, nausea and vomiting Genitourinary: negative for abnormal menstrual periods, genital lesions, sexual problems and vaginal discharge, dysuria and urinary incontinence Integument/breast: negative for breast lump, breast tenderness and nipple discharge Hematologic/lymphatic: negative for bleeding and easy bruising Musculoskeletal:negative for back pain and muscle weakness Neurological: negative for dizziness, headaches, vertigo and weakness Endocrine: negative for diabetic symptoms including polydipsia, polyuria and skin dryness Allergic/Immunologic: negative for hay fever and urticaria      Objective:  Blood pressure 95/66, pulse (!) 56, resp. rate 16, height 5\' 7"  (1.702 m), weight 146 lb 12.8 oz (66.6 kg), last menstrual period 05/10/2022.  Body mass index is 22.99 kg/m.    General Appearance:    Alert, cooperative, no distress, appears stated age  Head:    Normocephalic, without obvious abnormality, atraumatic  Eyes:    PERRL, conjunctiva/corneas clear, EOM's intact, both eyes  Ears:    Normal external ear canals, both ears  Nose:   Nares normal, septum midline, mucosa normal, no drainage or sinus tenderness  Throat:   Lips, mucosa, and tongue normal; teeth and gums normal  Neck:   Supple, symmetrical, trachea midline, no adenopathy; thyroid: no enlargement/tenderness/nodules; no carotid bruit or JVD  Back:      Symmetric, no curvature, ROM normal, no CVA tenderness  Lungs:     Clear to auscultation bilaterally, respirations unlabored  Chest Wall:    No tenderness or deformity   Heart:    Regular rate and rhythm, S1 and S2 normal, no murmur, rub or gallop  Breast Exam:    No tenderness, masses, or nipple abnormality  Abdomen:     Soft, non-tender, bowel sounds active all four quadrants, no masses, no organomegaly.    Genitalia:    Pelvic:external genitalia normal, vagina without lesions, discharge, or tenderness, rectovaginal septum  normal. Cervix normal in appearance, no cervical motion tenderness, no adnexal masses or tenderness.  Uterus normal size, shape, mobile, regular contours, nontender.  Rectal:    Normal external sphincter.  No hemorrhoids appreciated. Internal exam not done.   Extremities:   Extremities normal, atraumatic, no cyanosis or edema  Pulses:   2+ and symmetric all extremities  Skin:   Skin color, texture, turgor normal, no rashes or lesions  Lymph nodes:  Cervical, supraclavicular, and axillary nodes normal  Neurologic:   CNII-XII intact, normal strength, sensation and reflexes throughout   .  Labs:  Lab Results  Component Value Date   WBC 6.7 11/28/2020   HGB 13.7 11/28/2020   HCT 41.6 11/28/2020   MCV 89.1 11/28/2020   PLT 186 11/28/2020    Lab Results  Component Value Date   CREATININE 0.70 11/28/2020   BUN 23 (H) 11/28/2020   NA 137 11/28/2020   K 3.6 11/28/2020   CL 104 11/28/2020   CO2 22 11/28/2020    Lab Results  Component Value Date   ALT 16 11/28/2020   AST 23 11/28/2020   ALKPHOS 88 11/28/2020   BILITOT 1.2 11/28/2020    No results found for: "TSH"   Assessment:   1. Well woman exam with routine gynecological exam   2. Secondary amenorrhea   3. Decreased libido      Plan:  Blood tests: Has had labs performed with bariatric surgeon in the past mont or two. Breast self exam technique reviewed and patient encouraged to perform self-exam  monthly. Contraception: none.  Discussed healthy lifestyle modifications. Mammogram  : Not age appropriate Pap smear  UTD . COVID vaccination status: up to date.  Flu Vaccine: Scheduled for Nov. 1 Discussed decreased libido, will order hormonal labs, as patient also now amenorrheic x 2 months.  Follow up in 1 year for annual exam   Rubie Maid, MD [place]

## 2022-07-07 NOTE — Patient Instructions (Signed)
Breast Self-Awareness Breast self-awareness is knowing how your breasts look and feel. You need to: Check your breasts on a regular basis. Tell your doctor about any changes. Become familiar with the look and feel of your breasts. This can help you catch a breast problem while it is still small and can be treated. You should do breast self-exams even if you have breast implants. What you need: A mirror. A well-lit room. A pillow or other soft object. How to do a breast self-exam Follow these steps to do a breast self-exam: Look for changes  Take off all the clothes above your waist. Stand in front of a mirror in a room with good lighting. Put your hands down at your sides. Compare your breasts in the mirror. Look for any difference between them, such as: A difference in shape. A difference in size. Wrinkles, dips, and bumps in one breast and not the other. Look at each breast for changes in the skin, such as: Redness. Scaly areas. Skin that has gotten thicker. Dimpling. Open sores (ulcers). Look for changes in your nipples, such as: Fluid coming out of a nipple. Fluid around a nipple. Bleeding. Dimpling. Redness. A nipple that looks pushed in (retracted), or that has changed position. Feel for changes Lie on your back. Feel each breast. To do this: Pick a breast to feel. Place a pillow under the shoulder closest to that breast. Put the arm closest to that breast behind your head. Feel the nipple area of that breast using the hand of your other arm. Feel the area with the pads of your three middle fingers by making small circles with your fingers. Use light, medium, and firm pressure. Continue the overlapping circles, moving downward over the breast. Keep making circles with your fingers. Stop when you feel your ribs. Start making circles with your fingers again, this time going upward until you reach your collarbone. Then, make circles outward across your breast and into your  armpit area. Squeeze your nipple. Check for discharge and lumps. Repeat these steps to check your other breast. Sit or stand in the tub or shower. With soapy water on your skin, feel each breast the same way you did when you were lying down. Write down what you find Writing down what you find can help you remember what to tell your doctor. Write down: What is normal for each breast. Any changes you find in each breast. These include: The kind of changes you find. A tender or painful breast. Any lump you find. Write down its size and where it is. When you last had your monthly period (menstrual cycle). General tips If you are breastfeeding, the best time to check your breasts is after you feed your baby or after you use a breast pump. If you get monthly bleeding, the best time to check your breasts is 5-7 days after your monthly cycle ends. With time, you will become comfortable with the self-exam. You will also start to know if there are changes in your breasts. Contact a doctor if: You see a change in the shape or size of your breasts or nipples. You see a change in the skin of your breast or nipples, such as red or scaly skin. You have fluid coming from your nipples that is not normal. You find a new lump or thick area. You have breast pain. You have any concerns about your breast health. Summary Breast self-awareness includes looking for changes in your breasts and feeling for changes   within your breasts. You should do breast self-awareness in front of a mirror in a well-lit room. If you get monthly periods (menstrual cycles), the best time to check your breasts is 5-7 days after your period ends. Tell your doctor about any changes you see in your breasts. Changes include changes in size, changes on the skin, painful or tender breasts, or fluid from your nipples that is not normal. This information is not intended to replace advice given to you by your health care provider. Make sure  you discuss any questions you have with your health care provider. Document Revised: 07/04/2021 Document Reviewed: 07/04/2021 Elsevier Patient Education  2023 Elsevier Inc. Preventive Care 21-39 Years Old, Female Preventive care refers to lifestyle choices and visits with your health care provider that can promote health and wellness. Preventive care visits are also called wellness exams. What can I expect for my preventive care visit? Counseling During your preventive care visit, your health care provider may ask about your: Medical history, including: Past medical problems. Family medical history. Pregnancy history. Current health, including: Menstrual cycle. Method of birth control. Emotional well-being. Home life and relationship well-being. Sexual activity and sexual health. Lifestyle, including: Alcohol, nicotine or tobacco, and drug use. Access to firearms. Diet, exercise, and sleep habits. Work and work environment. Sunscreen use. Safety issues such as seatbelt and bike helmet use. Physical exam Your health care provider may check your: Height and weight. These may be used to calculate your BMI (body mass index). BMI is a measurement that tells if you are at a healthy weight. Waist circumference. This measures the distance around your waistline. This measurement also tells if you are at a healthy weight and may help predict your risk of certain diseases, such as type 2 diabetes and high blood pressure. Heart rate and blood pressure. Body temperature. Skin for abnormal spots. What immunizations do I need?  Vaccines are usually given at various ages, according to a schedule. Your health care provider will recommend vaccines for you based on your age, medical history, and lifestyle or other factors, such as travel or where you work. What tests do I need? Screening Your health care provider may recommend screening tests for certain conditions. This may include: Pelvic exam  and Pap test. Lipid and cholesterol levels. Diabetes screening. This is done by checking your blood sugar (glucose) after you have not eaten for a while (fasting). Hepatitis B test. Hepatitis C test. HIV (human immunodeficiency virus) test. STI (sexually transmitted infection) testing, if you are at risk. BRCA-related cancer screening. This may be done if you have a family history of breast, ovarian, tubal, or peritoneal cancers. Talk with your health care provider about your test results, treatment options, and if necessary, the need for more tests. Follow these instructions at home: Eating and drinking  Eat a healthy diet that includes fresh fruits and vegetables, whole grains, lean protein, and low-fat dairy products. Take vitamin and mineral supplements as recommended by your health care provider. Do not drink alcohol if: Your health care provider tells you not to drink. You are pregnant, may be pregnant, or are planning to become pregnant. If you drink alcohol: Limit how much you have to 0-1 drink a day. Know how much alcohol is in your drink. In the [place] one drink equals one 12 oz bottle of beer (355 mL), one 5 oz glass of wine (148 mL), or one 1 oz glass of hard liquor (44 mL). Lifestyle Brush your teeth every morning and   night with fluoride toothpaste. Floss one time each day. Exercise for at least 30 minutes 5 or more days each week. Do not use any products that contain nicotine or tobacco. These products include cigarettes, chewing tobacco, and vaping devices, such as e-cigarettes. If you need help quitting, ask your health care provider. Do not use drugs. If you are sexually active, practice safe sex. Use a condom or other form of protection to prevent STIs. If you do not wish to become pregnant, use a form of birth control. If you plan to become pregnant, see your health care provider for a prepregnancy visit. Find healthy ways to manage stress, such as: Meditation, yoga, or  listening to music. Journaling. Talking to a trusted person. Spending time with friends and family. Minimize exposure to UV radiation to reduce your risk of skin cancer. Safety Always wear your seat belt while driving or riding in a vehicle. Do not drive: If you have been drinking alcohol. Do not ride with someone who has been drinking. If you have been using any mind-altering substances or drugs. While texting. When you are tired or distracted. Wear a helmet and other protective equipment during sports activities. If you have firearms in your house, make sure you follow all gun safety procedures. Seek help if you have been physically or sexually abused. What's next? Go to your health care provider once a year for an annual wellness visit. Ask your health care provider how often you should have your eyes and teeth checked. Stay up to date on all vaccines. This information is not intended to replace advice given to you by your health care provider. Make sure you discuss any questions you have with your health care provider. Document Revised: 02/27/2021 Document Reviewed: 02/27/2021 Elsevier Patient Education  2023 Elsevier Inc.  

## 2022-07-08 ENCOUNTER — Encounter: Payer: Self-pay | Admitting: Obstetrics and Gynecology

## 2022-07-08 ENCOUNTER — Ambulatory Visit (INDEPENDENT_AMBULATORY_CARE_PROVIDER_SITE_OTHER): Payer: 59 | Admitting: Obstetrics and Gynecology

## 2022-07-08 VITALS — BP 95/66 | HR 56 | Resp 16 | Ht 67.0 in | Wt 146.8 lb

## 2022-07-08 DIAGNOSIS — N911 Secondary amenorrhea: Secondary | ICD-10-CM | POA: Diagnosis not present

## 2022-07-08 DIAGNOSIS — Z01419 Encounter for gynecological examination (general) (routine) without abnormal findings: Secondary | ICD-10-CM | POA: Diagnosis not present

## 2022-07-08 DIAGNOSIS — R6882 Decreased libido: Secondary | ICD-10-CM | POA: Diagnosis not present

## 2022-07-08 DIAGNOSIS — Z13220 Encounter for screening for lipoid disorders: Secondary | ICD-10-CM

## 2022-07-08 DIAGNOSIS — Z131 Encounter for screening for diabetes mellitus: Secondary | ICD-10-CM

## 2022-07-12 LAB — ESTRADIOL: Estradiol: 82.2 pg/mL

## 2022-07-12 LAB — TESTOSTERONE, FREE, TOTAL, SHBG
Sex Hormone Binding: 189 nmol/L — ABNORMAL HIGH (ref 24.6–122.0)
Testosterone, Free: <0.2 pg/mL (ref 0.0–4.2)
Testosterone: 4 ng/dL — ABNORMAL LOW (ref 8–60)

## 2022-07-12 LAB — FSH/LH
FSH: 6 m[IU]/mL
LH: 7.2 m[IU]/mL

## 2022-07-12 LAB — PROGESTERONE: Progesterone: 12.8 ng/mL

## 2022-07-12 LAB — BETA HCG QUANT (REF LAB): hCG Quant: <1 m[IU]/mL

## 2022-07-12 LAB — PROLACTIN: Prolactin: 7.9 ng/mL (ref 4.8–23.3)

## 2022-07-23 ENCOUNTER — Ambulatory Visit: Payer: 59 | Admitting: Obstetrics and Gynecology

## 2022-07-25 ENCOUNTER — Encounter: Payer: Self-pay | Admitting: Obstetrics and Gynecology

## 2022-07-25 ENCOUNTER — Telehealth (INDEPENDENT_AMBULATORY_CARE_PROVIDER_SITE_OTHER): Payer: 59 | Admitting: Obstetrics and Gynecology

## 2022-07-25 VITALS — Ht 67.0 in | Wt 151.0 lb

## 2022-07-25 DIAGNOSIS — R6882 Decreased libido: Secondary | ICD-10-CM

## 2022-07-25 DIAGNOSIS — Z8742 Personal history of other diseases of the female genital tract: Secondary | ICD-10-CM | POA: Diagnosis not present

## 2022-07-25 NOTE — Progress Notes (Deleted)
   I connected with  Karen Schroeder on 07/25/22 by a video enabled telemedicine application and verified that I am speaking with the correct person using two identifiers.   I discussed the limitations of evaluation and management by telemedicine. The patient expressed understanding and agreed to proceed.

## 2022-07-25 NOTE — Progress Notes (Unsigned)
   Virtual Visit via Video Note  I connected with Karen Schroeder on 07/26/22 at  4:00 PM EST by a video enabled telemedicine application and verified that I am speaking with the correct person using two identifiers.  Location: Patient: Home Provider: Office   I discussed the limitations of evaluation and management by telemedicine and the availability of in person appointments. The patient expressed understanding and agreed to proceed.  History of Present Illness: Karen Schroeder is a 36 y.o. G0P0000 female who presents for follow up of labs. Patient was previously experiencing issues with amenorrhea x 2 months, as well as decreased libido for over 1 year.  She does report that since her visit several weeks ago, she did finally have a menstrual cycle, November 1-5.    Observations/Objective: Height 5\' 7"  (1.702 m), weight 151 lb (68.5 kg), last menstrual period 07/16/2022. Gen App: NAD Remainder of exam deferred.    Labs:  Results for orders placed or performed in visit on 07/08/22  FSH/LH  Result Value Ref Range   LH 7.2 mIU/mL   FSH 6.0 mIU/mL  Estradiol  Result Value Ref Range   Estradiol 82.2 pg/mL  Progesterone  Result Value Ref Range   Progesterone 12.8 ng/mL  Testosterone, Free, Total, SHBG  Result Value Ref Range   Testosterone 4 (L) 8 - 60 ng/dL   Testosterone, Free 07/10/22 0.0 - 4.2 pg/mL   Sex Hormone Binding 189.0 (H) 24.6 - 122.0 nmol/L  Prolactin  Result Value Ref Range   Prolactin 7.9 4.8 - 23.3 ng/mL  Beta hCG quant (ref lab)  Result Value Ref Range   hCG Quant <1 mIU/mL    Assessment and Plan: 1. History of amenorrhea - Patient notes that she recently had a menstrual cycle after 2 months of amenorrhea.  Unknown cause at this time. Advised that if cycles continue to be irregular, can discuss management options to regulate cycles (such as hormonal supplementation).   2. Decreased libido - Reviewed labs, mild decrease in testosterone noted.  I  discussed options for management of decreased libido, including natural/herbal supplementation, or prescription options such as Vyleesi or Addyi.  Given info on all options through Mychart.     Follow Up Instructions: Patient to inform MD if prescription method is desired.    I discussed the assessment and treatment plan with the patient. The patient was provided an opportunity to ask questions and all were answered. The patient agreed with the plan and demonstrated an understanding of the instructions.   The patient was advised to call back or seek an in-person evaluation if the symptoms worsen or if the condition fails to improve as anticipated.  I provided 8 minutes of non-face-to-face time during this encounter.   <5.3, MD [facility] at [facility]

## 2022-07-25 NOTE — Patient Instructions (Signed)
Windi,   Check out this link for the herbal supplements for boosting sex drive.  Also can consider Horny Goat Weed and Damiana tea as herbal options as well.   https://explore.globalhealing.com/10-best-herbs-boosting-female-sex-drive/    The two prescription medications are called Vyleesi and Addyi. Please check out their individual websites to learn more about if either of these medications would be a good fit for you.     Dr. Valentino Saxon

## 2022-07-26 ENCOUNTER — Encounter: Payer: Self-pay | Admitting: Obstetrics and Gynecology

## 2023-08-25 ENCOUNTER — Encounter: Payer: Self-pay | Admitting: Obstetrics and Gynecology

## 2023-08-25 ENCOUNTER — Ambulatory Visit (INDEPENDENT_AMBULATORY_CARE_PROVIDER_SITE_OTHER): Payer: 59 | Admitting: Obstetrics and Gynecology

## 2023-08-25 VITALS — BP 99/63 | HR 65 | Resp 16 | Ht 67.0 in | Wt 156.8 lb

## 2023-08-25 DIAGNOSIS — Z9884 Bariatric surgery status: Secondary | ICD-10-CM

## 2023-08-25 DIAGNOSIS — N926 Irregular menstruation, unspecified: Secondary | ICD-10-CM

## 2023-08-25 DIAGNOSIS — Z01419 Encounter for gynecological examination (general) (routine) without abnormal findings: Secondary | ICD-10-CM | POA: Diagnosis not present

## 2023-08-25 DIAGNOSIS — R6882 Decreased libido: Secondary | ICD-10-CM

## 2023-08-25 NOTE — Patient Instructions (Addendum)
Preventive Care 21-39 Years Old, Female Preventive care refers to lifestyle choices and visits with your health care provider that can promote health and wellness. Preventive care visits are also called wellness exams. What can I expect for my preventive care visit? Counseling During your preventive care visit, your health care provider may ask about your: Medical history, including: Past medical problems. Family medical history. Pregnancy history. Current health, including: Menstrual cycle. Method of birth control. Emotional well-being. Home life and relationship well-being. Sexual activity and sexual health. Lifestyle, including: Alcohol, nicotine or tobacco, and drug use. Access to firearms. Diet, exercise, and sleep habits. Work and work environment. Sunscreen use. Safety issues such as seatbelt and bike helmet use. Physical exam Your health care provider may check your: Height and weight. These may be used to calculate your BMI (body mass index). BMI is a measurement that tells if you are at a healthy weight. Waist circumference. This measures the distance around your waistline. This measurement also tells if you are at a healthy weight and may help predict your risk of certain diseases, such as type 2 diabetes and high blood pressure. Heart rate and blood pressure. Body temperature. Skin for abnormal spots. What immunizations do I need?  Vaccines are usually given at various ages, according to a schedule. Your health care provider will recommend vaccines for you based on your age, medical history, and lifestyle or other factors, such as travel or where you work. What tests do I need? Screening Your health care provider may recommend screening tests for certain conditions. This may include: Pelvic exam and Pap test. Lipid and cholesterol levels. Diabetes screening. This is done by checking your blood sugar (glucose) after you have not eaten for a while (fasting). Hepatitis  B test. Hepatitis C test. HIV (human immunodeficiency virus) test. STI (sexually transmitted infection) testing, if you are at risk. BRCA-related cancer screening. This may be done if you have a family history of breast, ovarian, tubal, or peritoneal cancers. Talk with your health care provider about your test results, treatment options, and if necessary, the need for more tests. Follow these instructions at home: Eating and drinking  Eat a healthy diet that includes fresh fruits and vegetables, whole grains, lean protein, and low-fat dairy products. Take vitamin and mineral supplements as recommended by your health care provider. Do not drink alcohol if: Your health care provider tells you not to drink. You are pregnant, may be pregnant, or are planning to become pregnant. If you drink alcohol: Limit how much you have to 0-1 drink a day. Know how much alcohol is in your drink. In the [place] one drink equals one 12 oz bottle of beer (355 mL), one 5 oz glass of wine (148 mL), or one 1 oz glass of hard liquor (44 mL). Lifestyle Brush your teeth every morning and night with fluoride toothpaste. Floss one time each day. Exercise for at least 30 minutes 5 or more days each week. Do not use any products that contain nicotine or tobacco. These products include cigarettes, chewing tobacco, and vaping devices, such as e-cigarettes. If you need help quitting, ask your health care provider. Do not use drugs. If you are sexually active, practice safe sex. Use a condom or other form of protection to prevent STIs. If you do not wish to become pregnant, use a form of birth control. If you plan to become pregnant, see your health care provider for a prepregnancy visit. Find healthy ways to manage stress, such as: Meditation,   yoga, or listening to music. Journaling. Talking to a trusted person. Spending time with friends and family. Minimize exposure to UV radiation to reduce your risk of skin  cancer. Safety Always wear your seat belt while driving or riding in a vehicle. Do not drive: If you have been drinking alcohol. Do not ride with someone who has been drinking. If you have been using any mind-altering substances or drugs. While texting. When you are tired or distracted. Wear a helmet and other protective equipment during sports activities. If you have firearms in your house, make sure you follow all gun safety procedures. Seek help if you have been physically or sexually abused. What's next? Go to your health care provider once a year for an annual wellness visit. Ask your health care provider how often you should have your eyes and teeth checked. Stay up to date on all vaccines. This information is not intended to replace advice given to you by your health care provider. Make sure you discuss any questions you have with your health care provider. Document Revised: 02/27/2021 Document Reviewed: 02/27/2021 Elsevier Patient Education  2024 Elsevier Inc. Breast Self-Awareness Breast self-awareness is knowing how your breasts look and feel. You need to: Check your breasts on a regular basis. Tell your doctor about any changes. Become familiar with the look and feel of your breasts. This can help you catch a breast problem while it is still small and can be treated. You should do breast self-exams even if you have breast implants. What you need: A mirror. A well-lit room. A pillow or other soft object. How to do a breast self-exam Follow these steps to do a breast self-exam: Look for changes  Take off all the clothes above your waist. Stand in front of a mirror in a room with good lighting. Put your hands down at your sides. Compare your breasts in the mirror. Look for any difference between them, such as: A difference in shape. A difference in size. Wrinkles, dips, and bumps in one breast and not the other. Look at each breast for changes in the skin, such  as: Redness. Scaly areas. Skin that has gotten thicker. Dimpling. Open sores (ulcers). Look for changes in your nipples, such as: Fluid coming out of a nipple. Fluid around a nipple. Bleeding. Dimpling. Redness. A nipple that looks pushed in (retracted), or that has changed position. Feel for changes Lie on your back. Feel each breast. To do this: Pick a breast to feel. Place a pillow under the shoulder closest to that breast. Put the arm closest to that breast behind your head. Feel the nipple area of that breast using the hand of your other arm. Feel the area with the pads of your three middle fingers by making small circles with your fingers. Use light, medium, and firm pressure. Continue the overlapping circles, moving downward over the breast. Keep making circles with your fingers. Stop when you feel your ribs. Start making circles with your fingers again, this time going upward until you reach your collarbone. Then, make circles outward across your breast and into your armpit area. Squeeze your nipple. Check for discharge and lumps. Repeat these steps to check your other breast. Sit or stand in the tub or shower. With soapy water on your skin, feel each breast the same way you did when you were lying down. Write down what you find Writing down what you find can help you remember what to tell your doctor. Write down: What is   normal for each breast. Any changes you find in each breast. These include: The kind of changes you find. A tender or painful breast. Any lump you find. Write down its size and where it is. When you last had your monthly period (menstrual cycle). General tips If you are breastfeeding, the best time to check your breasts is after you feed your baby or after you use a breast pump. If you get monthly bleeding, the best time to check your breasts is 5-7 days after your monthly cycle ends. With time, you will become comfortable with the self-exam. You will  also start to know if there are changes in your breasts. Contact a doctor if: You see a change in the shape or size of your breasts or nipples. You see a change in the skin of your breast or nipples, such as red or scaly skin. You have fluid coming from your nipples that is not normal. You find a new lump or thick area. You have breast pain. You have any concerns about your breast health. Summary Breast self-awareness includes looking for changes in your breasts and feeling for changes within your breasts. You should do breast self-awareness in front of a mirror in a well-lit room. If you get monthly periods (menstrual cycles), the best time to check your breasts is 5-7 days after your period ends. Tell your doctor about any changes you see in your breasts. Changes include changes in size, changes on the skin, painful or tender breasts, or fluid from your nipples that is not normal. This information is not intended to replace advice given to you by your health care provider. Make sure you discuss any questions you have with your health care provider. Document Revised: 02/06/2022 Document Reviewed: 07/04/2021 Elsevier Patient Education  2024 Elsevier Inc.  

## 2023-08-25 NOTE — Progress Notes (Signed)
GYNECOLOGY ANNUAL PHYSICAL EXAM PROGRESS NOTE  Subjective:    Karen Schroeder is a 37 y.o. G0P0000 female who presents for an annual exam.  The patient is sexually active. The patient participates in regular exercise: no. Has the patient ever been transfused or tattooed?: yes. The patient reports that there is not domestic violence in her life.   The patient has the following complaints today: Reports that her cycle has become more regular over the past several months which she feels good about. Still noting issues with decreased libido and low hormones.  Attempted to try some natural remedies that were recommended last annual exam including Ashwaganda and horny goat weed however only noted changes in her mood but not her libido. 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  Is attempting to figure out day by day what her normally is with regards to her food intake.  Menstrual History: Menarche age: 73 Patient's last menstrual period was 08/13/2023. Period Cycle (Days): 28 Period Duration (Days): 7 Period Pattern: Regular Menstrual Flow: Moderate Menstrual Control: Maxi pad Menstrual Control Change Freq (Hours): 1-2 Dysmenorrhea: (!) Mild Dysmenorrhea Symptoms: Cramping, Diarrhea   Gynecologic History:  Contraception: none History of STI's: Denies Last Pap: 05/09/2021. Results were: normal.  Notes h/o abnormal pap smears.    Upstream - 08/25/23 1443       Pregnancy Intention Screening   Does the patient want to become pregnant in the next year? No    Does the patient's partner want to become pregnant in the next year? No    Would the patient like to discuss contraceptive options today? No      Contraception Wrap Up   Current Method No Method - Other Reason    End Method No Method - Other Reason    Contraception Counseling Provided No            The pregnancy intention screening data noted above was reviewed. Potential methods of contraception were  discussed. The patient elected to proceed with No Method - Other Reason.   OB History  Gravida Para Term Preterm AB Living  0 0 0 0 0 0  SAB IAB Ectopic Multiple Live Births  0 0 0 0 0    Past Medical History:  Diagnosis Date   Allergy    IBS (irritable bowel syndrome)    Neuropathy    Obesity    Vitamin D deficiency     Past Surgical History:  Procedure Laterality Date   ACNE CYST REMOVAL     on back   CHOLECYSTECTOMY     GASTRIC BYPASS  08/29/2019    Family History  Problem Relation Age of Onset   Uterine cancer Mother 12   Pancreatic cancer Maternal Grandmother    Liver cancer Maternal Grandmother    Throat cancer Paternal Grandmother    Prostate cancer Maternal Grandfather    Breast cancer Cousin    Sudden Cardiac Death Father    Ovarian cancer Neg Hx    Colon cancer Neg Hx     Social History   Socioeconomic History   Marital status: Married    Spouse name: Not on file   Number of children: Not on file   Years of education: Not on file   Highest education level: Not on file  Occupational History   Not on file  Tobacco Use   Smoking status: Former    Current packs/day: 0.00    Types: Cigarettes    Quit  date: 2009    Years since quitting: 15.9   Smokeless tobacco: Never  Vaping Use   Vaping status: Never Used  Substance and Sexual Activity   Alcohol use: Not Currently   Drug use: No   Sexual activity: Yes    Birth control/protection: None  Other Topics Concern   Not on file  Social History Narrative   Not on file   Social Determinants of Health   Financial Resource Strain: Not on file  Food Insecurity: Not on file  Transportation Needs: Not on file  Physical Activity: Not on file  Stress: Not on file  Social Connections: Not on file  Intimate Partner Violence: Not on file    Current Outpatient Medications on File Prior to Visit  Medication Sig Dispense Refill   azelastine (ASTELIN) 0.1 % nasal spray Place 1 spray into both nostrils 2  (two) times daily.     Calcium 500 MG tablet      cyanocobalamin (VITAMIN B12) 1000 MCG tablet Take by mouth.     dicyclomine (BENTYL) 10 MG capsule Take by mouth.     fluticasone (FLONASE) 50 MCG/ACT nasal spray Place into the nose.     Multiple Vitamins-Minerals (BARIATRIC MULTIVITAMINS/IRON) CAPS      No current facility-administered medications on file prior to visit.    Allergies  Allergen Reactions   Pea Other (See Comments)   Gramineae Pollens Hives    MULBERRY TREES-SNEEZING, SKIN TEST HIVES   Lactose Other (See Comments)    IBS     Review of Systems Constitutional: negative for chills, fatigue, fevers and sweats Eyes: negative for irritation, redness and visual disturbance Ears, nose, mouth, throat, and face: negative for hearing loss, nasal congestion, snoring and tinnitus Respiratory: negative for asthma, cough, sputum Cardiovascular: negative for chest pain, dyspnea, exertional chest pressure/discomfort, irregular heart beat, palpitations and syncope Gastrointestinal: negative for abdominal pain, change in bowel habits, nausea and vomiting Genitourinary: negative for abnormal menstrual periods, genital lesions, sexual problems and vaginal discharge, dysuria and urinary incontinence Integument/breast: negative for breast lump, breast tenderness and nipple discharge Hematologic/lymphatic: negative for bleeding and easy bruising Musculoskeletal:negative for back pain and muscle weakness Neurological: negative for dizziness, headaches, vertigo and weakness Endocrine: negative for diabetic symptoms including polydipsia, polyuria and skin dryness Allergic/Immunologic: negative for hay fever and urticaria      Objective:  Blood pressure 99/63, pulse 65, resp. rate 16, height 5\' 7"  (1.702 m), weight 156 lb 12.8 oz (71.1 kg), last menstrual period 08/13/2023.  Body mass index is 24.56 kg/m.   General Appearance:    Alert, cooperative, no distress, appears stated age  Head:     Normocephalic, without obvious abnormality, atraumatic  Eyes:    PERRL, conjunctiva/corneas clear, EOM's intact, both eyes  Ears:    Normal external ear canals, both ears  Nose:   Nares normal, septum midline, mucosa normal, no drainage or sinus tenderness  Throat:   Lips, mucosa, and tongue normal; teeth and gums normal  Neck:   Supple, symmetrical, trachea midline, no adenopathy; thyroid: no enlargement/tenderness/nodules; no carotid bruit or JVD  Back:     Symmetric, no curvature, ROM normal, no CVA tenderness  Lungs:     Clear to auscultation bilaterally, respirations unlabored  Chest Wall:    No tenderness or deformity   Heart:    Regular rate and rhythm, S1 and S2 normal, no murmur, rub or gallop  Breast Exam:    No tenderness, masses, or nipple abnormality  Abdomen:  Soft, non-tender, bowel sounds active all four quadrants, no masses, no organomegaly.    Genitalia:    Pelvic:external genitalia normal, vagina without lesions, discharge, or tenderness, rectovaginal septum  normal. Cervix normal in appearance, no cervical motion tenderness, no adnexal masses or tenderness.  Uterus normal size, shape, mobile, regular contours, nontender.  Rectal:    Normal external sphincter.  No hemorrhoids appreciated. Internal exam not done.   Extremities:   Extremities normal, atraumatic, no cyanosis or edema  Pulses:   2+ and symmetric all extremities  Skin:   Skin color, texture, turgor normal, no rashes or lesions  Lymph nodes:   Cervical, supraclavicular, and axillary nodes normal  Neurologic:   CNII-XII intact, normal strength, sensation and reflexes throughout     Labs:  Labs reviewed in care everywhere, completed 10/17/2022.  Assessment:   1. Well woman exam with routine gynecological exam   2. S/P gastric bypass   3. Decreased libido   4. Irregular menses      Plan:  - Blood tests: Reviewed in care everywhere.  Will be due again in February.  Labs typically drawn by her bariatric  doctor. - Breast self exam technique reviewed and patient encouraged to perform self-exam monthly. Contraception: none. - Discussed healthy lifestyle modifications. - Mammogram  : Ordered for next visit. - Pap smear  UTD . - Flu vaccine: UTD.  - Discussed other alternatives for decreased libido, as patient was noted to have testosterone deficiency on labs performed last year.  I discussed utilization of prescription medication such as Vyleesi, Addyi I, or consideration of testosterone supplementation through injections or topical agent.  Patient notes she will think about her options.  Given handouts for review. - Follow up in 1 year for annual exam   Hildred Laser, MD [facility]

## 2023-08-29 ENCOUNTER — Encounter: Payer: Self-pay | Admitting: Obstetrics and Gynecology

## 2023-09-02 MED ORDER — TESTOSTERONE CYPIONATE 100 MG/ML IM SOLN: 36.0000 mg | INTRAMUSCULAR | 0 refills | Status: DC

## 2023-09-07 ENCOUNTER — Ambulatory Visit: Payer: 59

## 2023-09-07 NOTE — Patient Instructions (Signed)
Testosterone Injection What is this medication? TESTOSTERONE (tes TOS ter one) is used to increase testosterone levels in your body. It belongs to a group of medications called androgen hormones. This medicine may be used for other purposes; ask your health care provider or pharmacist if you have questions. COMMON BRAND NAME(S): Andro-L.A., Aveed, Delatestryl, Depo-Testosterone, Testone CIK, Virilon What should I tell my care team before I take this medication? They need to know if you have any of these conditions: Cancer Diabetes Heart disease Kidney disease Liver disease Lung disease Prostate disease An unusual or allergic reaction to testosterone, other medications, foods, dyes, or preservatives If you or your partner are pregnant or trying to get pregnant Breastfeeding How should I use this medication? This medication is injected into a muscle. It is given by your care team in a hospital or clinic setting. A special MedGuide will be given to you by the pharmacist with each prescription and refill. Be sure to read this information carefully each time. Contact your care team about the use of this medication in children. While it be prescribed for children as young as 12 years for selected conditions, precautions do apply. Overdosage: If you think you have taken too much of this medicine contact a poison control center or emergency room at once. NOTE: This medicine is only for you. Do not share this medicine with others. What if I miss a dose? Try not to miss a dose. Your care team will tell you when your next injection is due. Notify the office if you are unable to keep an appointment. What may interact with this medication? Medications for diabetes Medications that treat or prevent blood clots like warfarin Oxyphenbutazone Propranolol Steroid medications like prednisone or cortisone This list may not describe all possible interactions. Give your health care provider a list of all  the medicines, herbs, non-prescription drugs, or dietary supplements you use. Also tell them if you smoke, drink alcohol, or use illegal drugs. Some items may interact with your medicine. What should I watch for while using this medication? Visit your care team for regular checks on your progress. They will need to check the level of testosterone in your blood. Heart attacks and strokes have been reported with the use of this medication. Get emergency help if you develop signs or symptoms of a heart attack or stroke. Talk to your care team about the risks and benefits of this medication. This medication may affect blood sugar levels. If you have diabetes, check with your care team before you change your diet or the dose of your diabetic medication. Talk to your care team if you wish to become pregnant or think you might be pregnant. This medication can cause serious birth defects if taken during pregnancy. This medication is banned from use in athletes by most athletic organizations. What side effects may I notice from receiving this medication? Side effects that you should report to your care team as soon as possible: Allergic reactions--skin rash, itching, hives, swelling of the face, lips, tongue, or throat Blood clot--pain, swelling, or warmth in the leg, shortness of breath, chest pain Heart attack--pain or tightness in the chest, shoulders, arms or jaw, nausea, shortness of breath, cold or clammy skin, feeling faint or lightheaded Increase in blood pressure Liver injury--right upper belly pain, loss of appetite, nausea, light-colored stool, dark yellow or brown urine, yellowing of the skin or eyes, unusual weakness or fatigue Mood swings, irritability, or hostility Prolonged or painful erection Sleep apnea--loud snoring, gasping  during sleep, daytime sleepiness Stroke--sudden numbness or weakness of the face, arm or leg, trouble speaking, confusion, trouble walking, loss of balance or  coordination, dizziness, severe headache, change in vision Swelling of the ankles, hands, or feet Thoughts of suicide or self-harm, worsening mood, feelings of depression Side effects that usually do not require medical attention (report to your care team if they continue or are bothersome): Acne Change in sex drive or performance Pain, redness, or irritation at the application site Unexpected breast tissue growth This list may not describe all possible side effects. Call your doctor for medical advice about side effects. You may report side effects to FDA at 1-800-FDA-1088. Where should I keep my medication? Keep out of the reach of children. This medication can be abused. Keep your medication in a safe place to protect it from theft. Do not share this medication with anyone. Selling or giving away this medication is dangerous and against the law. Store at room temperature between 20 and 25 degrees C (68 and 77 degrees F). Do not freeze. Protect from light. Follow the directions for the product you are prescribed. Throw away any unused medication after the expiration date. NOTE: This sheet is a summary. It may not cover all possible information. If you have questions about this medicine, talk to your doctor, pharmacist, or health care provider.  2024 Elsevier/Gold Standard (2022-07-08 00:00:00)

## 2023-09-07 NOTE — Progress Notes (Cosign Needed)
    NURSE VISIT NOTE  Subjective:    Patient ID: LARIZA ZELTNER, female    DOB: 1986-04-22, 37 y.o.   MRN: [id number]  HPI  Patient is a 37 y.o. G0P0000 female who presents for 0.42ml Testosterone injection per order from Hildred Laser, MD.   Objective:    LMP 08/13/2023   Testosterone injection administered IM right deltoid using aseptic technique. Pt tolerated well.   Lab Review  @THIS  VISIT ONLY@  Assessment:   No diagnosis found.   Plan:    Return to clinic 14 day.   Cornelius Moras, CMA

## 2023-09-11 ENCOUNTER — Ambulatory Visit: Payer: 59

## 2023-09-11 VITALS — BP 120/80 | Ht 67.0 in | Wt 155.0 lb

## 2023-09-11 DIAGNOSIS — R6882 Decreased libido: Secondary | ICD-10-CM

## 2023-09-11 MED ORDER — TESTOSTERONE CYPIONATE 100 MG/ML IM SOLN
100.0000 mg | Freq: Once | INTRAMUSCULAR | Status: AC
  Administered 2023-09-11: 100 mg via INTRAMUSCULAR

## 2023-09-13 MED ORDER — TESTOSTERONE CYPIONATE 100 MG/ML IM SOLN
36.0000 mg | INTRAMUSCULAR | Status: AC
Start: 2023-09-13 — End: ?

## 2023-09-13 NOTE — Addendum Note (Signed)
Addended by: Fabian November on: 09/13/2023 01:34 AM   Modules accepted: Orders

## 2023-09-25 ENCOUNTER — Ambulatory Visit (INDEPENDENT_AMBULATORY_CARE_PROVIDER_SITE_OTHER): Payer: 59

## 2023-09-25 VITALS — BP 111/66 | Wt 156.8 lb

## 2023-09-25 DIAGNOSIS — R6882 Decreased libido: Secondary | ICD-10-CM

## 2023-09-25 MED ORDER — TESTOSTERONE CYPIONATE 100 MG/ML IM SOLN
100.0000 mg | Freq: Once | INTRAMUSCULAR | Status: AC
  Administered 2023-09-25: 100 mg via INTRAMUSCULAR

## 2023-09-25 NOTE — Progress Notes (Signed)
    NURSE VISIT NOTE  Subjective:    Patient ID: Karen Schroeder, female    DOB: 1986/06/02, 38 y.o.   MRN: [id number]  HPI  Patient is a 38 y.o. G0P0000 female who presents for 0.40ml Testosterone  injection per order from Archie Savers, MD.   Objective:    BP 111/66 (BP Location: Left Arm, Patient Position: Sitting, Cuff Size: Normal)   Wt 156 lb 12.8 oz (71.1 kg)   BMI 24.56 kg/m   Cyanocobalamin 1076mcg/1ml administered IM right deltoid using aseptic technique. Pt tolerated well.   Lab Review  @THIS  VISIT ONLY@  Assessment:   1. Decreased libido      Plan:    Return to clinic as scheduled to see Dr. Savers Burnard LITTIE Donelda, CMA

## 2023-10-15 ENCOUNTER — Ambulatory Visit: Payer: 59 | Admitting: Obstetrics and Gynecology

## 2023-10-16 NOTE — Progress Notes (Signed)
    GYNECOLOGY PROGRESS NOTE  Subjective:    Patient ID: Karen Schroeder, female    DOB: 08-23-1986, 38 y.o.   MRN: [id number]  HPI  Patient is a 38 y.o. G0P0000 female who presents for to discuss management options for decreased libido. Labs notable for low testosterone . She was seen over the past month and received 2 injections of testosterone  but did not notice any change with initial dosing She has an appointment to be evaluated by a vascular specialist to have an ultrasound about her circulation. She was told that circulation issues may have something to do with her low libido.  Would like to hold off on any further treatments at this time until this symptom is explored.  Does note that her dietary issues are improving slowly.   The following portions of the patient's history were reviewed and updated as appropriate: allergies, current medications, past family history, past medical history, past social history, past surgical history, and problem list.  Review of Systems Pertinent items noted in HPI and remainder of comprehensive ROS otherwise negative.   Objective:   Blood pressure 108/68, pulse 63, height 5' 7 (1.702 m), weight 158 lb 6.4 oz (71.8 kg).  Body mass index is 24.81 kg/m. General appearance: alert, cooperative, and no distress Remainder of exam deferred   Assessment:   1. Decreased libido   2. 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 If her libido improves with management, no further interventions needed. If no significant findings noted during workup or if despite treatment for findings her libido does not improve, can either resume testosterone  therapy with dose titration (patient's preference) or can try other options such as Vyleesi or Adyyi.     Archie Savers, MD [facility]

## 2023-10-20 ENCOUNTER — Ambulatory Visit (INDEPENDENT_AMBULATORY_CARE_PROVIDER_SITE_OTHER): Payer: 59 | Admitting: Obstetrics and Gynecology

## 2023-10-20 ENCOUNTER — Encounter: Payer: Self-pay | Admitting: Obstetrics and Gynecology

## 2023-10-20 VITALS — BP 108/68 | HR 63 | Ht 67.0 in | Wt 158.4 lb

## 2023-10-20 DIAGNOSIS — R7989 Other specified abnormal findings of blood chemistry: Secondary | ICD-10-CM | POA: Diagnosis not present

## 2023-10-20 DIAGNOSIS — R6882 Decreased libido: Secondary | ICD-10-CM

## 2023-12-08 ENCOUNTER — Other Ambulatory Visit (INDEPENDENT_AMBULATORY_CARE_PROVIDER_SITE_OTHER): Payer: Self-pay | Admitting: Nurse Practitioner

## 2023-12-08 DIAGNOSIS — R202 Paresthesia of skin: Secondary | ICD-10-CM

## 2023-12-09 ENCOUNTER — Ambulatory Visit (INDEPENDENT_AMBULATORY_CARE_PROVIDER_SITE_OTHER): Payer: Self-pay

## 2023-12-09 ENCOUNTER — Encounter (INDEPENDENT_AMBULATORY_CARE_PROVIDER_SITE_OTHER): Payer: Self-pay | Admitting: Nurse Practitioner

## 2023-12-09 ENCOUNTER — Ambulatory Visit (INDEPENDENT_AMBULATORY_CARE_PROVIDER_SITE_OTHER): Payer: Self-pay | Admitting: Nurse Practitioner

## 2023-12-09 VITALS — BP 109/59 | HR 60 | Resp 18 | Ht 67.0 in | Wt 164.0 lb

## 2023-12-09 DIAGNOSIS — R200 Anesthesia of skin: Secondary | ICD-10-CM | POA: Diagnosis not present

## 2023-12-09 DIAGNOSIS — R202 Paresthesia of skin: Secondary | ICD-10-CM

## 2023-12-09 DIAGNOSIS — R198 Other specified symptoms and signs involving the digestive system and abdomen: Secondary | ICD-10-CM | POA: Diagnosis not present

## 2023-12-09 MED ORDER — CILOSTAZOL 100 MG PO TABS: 100.0000 mg | ORAL_TABLET | Freq: Two times a day (BID) | ORAL | 1 refills | Status: AC

## 2023-12-09 NOTE — Progress Notes (Signed)
 Subjective:    Patient ID: Karen Schroeder, female    DOB: 02/26/1986, 38 y.o.   MRN: [id number] Chief Complaint  Patient presents with   New Patient (Initial Visit)    np. ABI + consult. Paresthesia of both feet. NWG:NFAOZH,YQMVHQ    The patient is a 38 year old female who presents today as a referral from Dr. Greggory Stallion in regards to paresthesias of her bilateral lower extremities.  The patient underwent gastric bypass surgery in 2020.  About a month later she started having numbness and tingling symptoms.  Since about 2021 until now she is undergone a multitude of test as well as seeing a multitude of specialist and none have pinpointing an issue for her symptoms.  They have tested her for neuropathy which has been negative.  She notes that she has some bulging disc but they have found that there is no compressed nerves in her spine and nothing that should account for the symptoms that she is currently having.  She notes that it initially was just some numbness and tingling that was in her toes and the bottom of her feet but now its approach towards her calf and knee area as well.  In addition, she will have variable temperature sensations noting that sometimes her feet feel "hot or cold.  Initially it was just tingling but now her feet are significantly numb.  She notes that now she begins to have pain even with sheet touching her feet.  She also expresses pain and pressure in her groin area.  There is pain and discomfort in her pelvis.  She notes that she has never been formally diagnosed with fibroids but she does experience heavy flow and menses.    Review of Systems  Cardiovascular:  Positive for leg swelling.  Gastrointestinal:  Positive for abdominal distention and abdominal pain.  Neurological:  Positive for numbness.  All other systems reviewed and are negative.      Objective:   Physical Exam Vitals reviewed.  HENT:     Head: Normocephalic.  Cardiovascular:     Rate and Rhythm:  Normal rate.     Pulses:          Dorsalis pedis pulses are detected w/ Doppler on the right side and detected w/ Doppler on the left side.       Posterior tibial pulses are detected w/ Doppler on the right side and detected w/ Doppler on the left side.  Pulmonary:     Effort: Pulmonary effort is normal.  Skin:    General: Skin is warm and dry.  Neurological:     Mental Status: She is alert and oriented to person, place, and time.  Psychiatric:        Mood and Affect: Mood normal.        Behavior: Behavior normal.        Thought Content: Thought content normal.        Judgment: Judgment normal.     BP (!) 109/59   Pulse 60   Resp 18   Ht 5\' 7"  (1.702 m)   Wt 164 lb (74.4 kg)   BMI 25.69 kg/m   Past Medical History:  Diagnosis Date   Allergy    IBS (irritable bowel syndrome)    Neuropathy    Obesity    Vitamin D deficiency     Social History   Socioeconomic History   Marital status: Married    Spouse name: Not on file   Number of children: Not  on file   Years of education: Not on file   Highest education level: Not on file  Occupational History   Not on file  Tobacco Use   Smoking status: Former    Current packs/day: 0.00    Types: Cigarettes    Quit date: 2009    Years since quitting: 16.2   Smokeless tobacco: Never  Vaping Use   Vaping status: Never Used  Substance and Sexual Activity   Alcohol use: Not Currently   Drug use: No   Sexual activity: Yes    Birth control/protection: None  Other Topics Concern   Not on file  Social History Narrative   Not on file   Social Drivers of Health   Financial Resource Strain: Not on file  Food Insecurity: Not on file  Transportation Needs: Not on file  Physical Activity: Not on file  Stress: Not on file  Social Connections: Not on file  Intimate Partner Violence: Not on file    Past Surgical History:  Procedure Laterality Date   ACNE CYST REMOVAL     on back   CHOLECYSTECTOMY     GASTRIC BYPASS   08/29/2019    Family History  Problem Relation Age of Onset   Uterine cancer Mother 31   Pancreatic cancer Maternal Grandmother    Liver cancer Maternal Grandmother    Throat cancer Paternal Grandmother    Prostate cancer Maternal Grandfather    Breast cancer Cousin    Sudden Cardiac Death Father    Ovarian cancer Neg Hx    Colon cancer Neg Hx     Allergies  Allergen Reactions   Tolterodine Diarrhea   Magnesium Stearate Other (See Comments), Diarrhea and Nausea Only   Pea Other (See Comments)   Gluten Meal Diarrhea, Nausea Only and Other (See Comments)   Gramineae Pollens Hives    MULBERRY TREES-SNEEZING, SKIN TEST HIVES   Lactose Other (See Comments)    IBS       Latest Ref Rng & Units 11/28/2020    8:27 AM  CBC  WBC 4.0 - 10.5 K/uL 6.7   Hemoglobin 12.0 - 15.0 g/dL 16.1   Hematocrit 09.6 - 46.0 % 41.6   Platelets 150 - 400 K/uL 186       CMP     Component Value Date/Time   NA 137 11/28/2020 0827   K 3.6 11/28/2020 0827   CL 104 11/28/2020 0827   CO2 22 11/28/2020 0827   GLUCOSE 79 11/28/2020 0827   BUN 23 (H) 11/28/2020 0827   CREATININE 0.70 11/28/2020 0827   CALCIUM 9.6 11/28/2020 0827   PROT 7.4 11/28/2020 0827   ALBUMIN 4.3 11/28/2020 0827   AST 23 11/28/2020 0827   ALT 16 11/28/2020 0827   ALKPHOS 88 11/28/2020 0827   BILITOT 1.2 11/28/2020 0827   GFRNONAA >60 11/28/2020 0827     No results found.     Assessment & Plan:   1. Numbness and tingling (Primary) Today the patient's noninvasive studies indicate that she could have some small vessel disease, however her symptoms would indicate more large vessel disease.  Based on her discordant ABIs, I feel would be prudent to obtain additional ultrasounds just to evaluate if there are stenoses not picked up by her ABIs.  Because we have ordered a CT venogram for the patient when she returns to discuss the studies will plan on having an aortic iliac duplex as well as bilateral arterial duplex as well  done.  Concerning the  possible small vessel disease, we will trial the patient on Pletal to see if this helps to improve her symptoms.  We discussed the possible side effects that may occur.  She is advised that if she experiences the side effects and they are not bearable for her to stop the medication.  2. Abdominal fullness The patient's description of abdominal fullness and discomfort is concerning for possible pelvic congestion syndrome and/or possible significant uterine fibroids.  Will order a CT venogram in order to evaluate the venous system for the patient in order to evaluate if possible pelvic congestion syndrome could be causing her pain and discomfort in her abdomen. - CT VENOGRAM ABD/PEL; Future   Current Outpatient Medications on File Prior to Visit  Medication Sig Dispense Refill   clobetasol (TEMOVATE) 0.05 % external solution Apply 1 Application topically 2 (two) times daily.     cyanocobalamin (VITAMIN B12) 1000 MCG tablet Take by mouth.     Fish Oil-Cholecalciferol (OMEGA-3 + D) 500-200 MG-UNIT CAPS Take 200 mg by mouth daily.     Vitamin D, Ergocalciferol, (DRISDOL) 1.25 MG (50000 UNIT) CAPS capsule Take 50,000 Units by mouth every 7 (seven) days.     Misc Natural Products (HORNY GOAT WEED PO) Take by mouth. (Patient not taking: Reported on 12/09/2023)     testosterone cypionate (DEPO-TESTOSTERONE) 100 MG/ML injection Inject 0.36 mLs (36 mg total) into the muscle every 28 (twenty-eight) days. For IM use only     No current facility-administered medications on file prior to visit.    There are no Patient Instructions on file for this visit. No follow-ups on file.   Georgiana Spinner, NP

## 2023-12-10 ENCOUNTER — Other Ambulatory Visit (INDEPENDENT_AMBULATORY_CARE_PROVIDER_SITE_OTHER): Payer: Self-pay | Admitting: Nurse Practitioner

## 2023-12-14 LAB — VAS US ABI WITH/WO TBI
Left ABI: 1.17
Right ABI: 1.18

## 2023-12-15 ENCOUNTER — Ambulatory Visit
Admission: RE | Admit: 2023-12-15 | Discharge: 2023-12-15 | Disposition: A | Discharge status: Home / Self Care | Source: Ambulatory Visit | Attending: Nurse Practitioner | Admitting: Nurse Practitioner

## 2023-12-15 DIAGNOSIS — R198 Other specified symptoms and signs involving the digestive system and abdomen: Secondary | ICD-10-CM | POA: Diagnosis present

## 2023-12-15 MED ORDER — IOHEXOL 350 MG/ML SOLN
100.0000 mL | Freq: Once | INTRAVENOUS | Status: AC | PRN
  Administered 2023-12-15: 100 mL via INTRAVENOUS

## 2023-12-22 ENCOUNTER — Telehealth (INDEPENDENT_AMBULATORY_CARE_PROVIDER_SITE_OTHER): Payer: Self-pay | Admitting: Nurse Practitioner

## 2023-12-22 NOTE — Telephone Encounter (Signed)
 LVM for patient to call to schedule fu with [facility]: fu CT scan done 12/15/23 + Aorta Iliac + Bilat Arterial Duplex see GS or JD

## 2024-02-02 ENCOUNTER — Encounter (INDEPENDENT_AMBULATORY_CARE_PROVIDER_SITE_OTHER): Payer: Self-pay

## 2024-02-11 ENCOUNTER — Other Ambulatory Visit (INDEPENDENT_AMBULATORY_CARE_PROVIDER_SITE_OTHER): Payer: Self-pay | Admitting: Nurse Practitioner

## 2024-02-11 DIAGNOSIS — R200 Anesthesia of skin: Secondary | ICD-10-CM

## 2024-02-17 NOTE — Progress Notes (Signed)
 MRN : [id number]  Karen Schroeder is a 38 y.o. (1986-01-21) female who presents with chief complaint of legs hurt and swell.  History of Present Illness:   The patient returns for follow up regarding paresthesias of her bilateral lower extremities. The patient underwent gastric bypass surgery in 2020. About a month later she started having numbness and tingling symptoms. Since about 2021 until now she is undergone a multitude of test as well as seeing a multitude of specialist and none have pinpointing an issue for her symptoms. They have tested her for neuropathy which has been negative. She notes that she has some bulging disc but they have found that there is no compressed nerves in her spine and nothing that should account for the symptoms that she is currently having. She notes that it initially was just some numbness and tingling that was in her toes and the bottom of her feet but now its approach towards her calf and knee area as well. In addition, she will have variable temperature sensations noting that sometimes her feet feel "hot or cold. Initially it was just tingling but now her feet are significantly numb. She notes that now she begins to have pain even with sheet touching her feet. She also expresses pain and pressure in her groin area. There is pain and discomfort in her pelvis. She notes that she has never been formally diagnosed with fibroids but she does experience heavy flow and menses.   CT venogram of the abdomen and pelvis dated December 21, 2023 is reviewed by me personally there is no evidence for pelvic congestion syndrome.  There is no abnormality of the venous or arterial systems identified.  There is no lymphadenopathy noted.  Additionally, the uterus is normal in appearance no evidence for uterine fibroids.  Duplex of the abdominal aorta and iliac arteries is normal.  Duplex ultrasound of the bilateral lower extremity arterial system is normal as well no hemodynamically  significant stenoses or other abnormalities are identified.  Triphasic flow is noted.  No outpatient medications have been marked as taking for the 02/18/24 encounter (Appointment) with Prescilla Brod, Ninette Basque, MD.    Past Medical History:  Diagnosis Date   Allergy    IBS (irritable bowel syndrome)    Neuropathy    Obesity    Vitamin D deficiency     Past Surgical History:  Procedure Laterality Date   ACNE CYST REMOVAL     on back   CHOLECYSTECTOMY     GASTRIC BYPASS  08/29/2019    Social History Social History   Tobacco Use   Smoking status: Former    Current packs/day: 0.00    Types: Cigarettes    Quit date: 2009    Years since quitting: 16.4   Smokeless tobacco: Never  Vaping Use   Vaping status: Never Used  Substance Use Topics   Alcohol use: Not Currently   Drug use: No    Family History Family History  Problem Relation Age of Onset   Uterine cancer Mother 35   Pancreatic cancer Maternal Grandmother    Liver cancer Maternal Grandmother    Throat cancer Paternal Grandmother    Prostate cancer Maternal Grandfather    Breast cancer Cousin    Sudden Cardiac Death Father    Ovarian cancer Neg Hx    Colon cancer Neg Hx     Allergies  Allergen Reactions   Tolterodine Diarrhea   Magnesium Stearate Other (See Comments),  Diarrhea and Nausea Only   Pea Other (See Comments)   Gluten Meal Diarrhea, Nausea Only and Other (See Comments)   Gramineae Pollens Hives    MULBERRY TREES-SNEEZING, SKIN TEST HIVES   Lactose Other (See Comments)    IBS     REVIEW OF SYSTEMS (Negative unless checked)  Constitutional: [] Weight loss  [] Fever  [] Chills Cardiac: [] Chest pain   [] Chest pressure   [] Palpitations   [] Shortness of breath when laying flat   [] Shortness of breath with exertion. Vascular:  [] Pain in legs with walking   [x] Pain in legs at rest  [] History of DVT   [] Phlebitis   [x] Swelling in legs   [] Varicose veins   [] Non-healing ulcers Pulmonary:   [] Uses home oxygen    [] Productive cough   [] Hemoptysis   [] Wheeze  [] COPD   [] Asthma Neurologic:  [] Dizziness   [] Seizures   [] History of stroke   [] History of TIA  [] Aphasia   [] Vissual changes   [] Weakness or numbness in arm   [] Weakness or numbness in leg Musculoskeletal:   [] Joint swelling   [] Joint pain   [] Low back pain Hematologic:  [] Easy bruising  [] Easy bleeding   [] Hypercoagulable state   [] Anemic Gastrointestinal:  [] Diarrhea   [] Vomiting  [] Gastroesophageal reflux/heartburn   [] Difficulty swallowing. Genitourinary:  [] Chronic kidney disease   [] Difficult urination  [] Frequent urination   [] Blood in urine Skin:  [] Rashes   [] Ulcers  Psychological:  [] History of anxiety   []  History of major depression.  Physical Examination  There were no vitals filed for this visit. There is no height or weight on file to calculate BMI. Gen: WD/WN, NAD Head: [place]/AT, No temporalis wasting.  Ear/Nose/Throat: Hearing grossly intact, nares w/o erythema or drainage, pinna without lesions Eyes: PER, EOMI, sclera nonicteric.  Neck: Supple, no gross masses.  No JVD.  Pulmonary:  Good air movement, no audible wheezing, no use of accessory muscles.  Cardiac: RRR, precordium not hyperdynamic. Vascular:  scattered varicosities present bilaterally.  Moderate venous stasis changes to the legs bilaterally.  Trace soft pitting edema. CEAP [id number]   Vessel Right Left  Radial Palpable Palpable  Gastrointestinal: soft, non-distended. No guarding/no peritoneal signs.  Musculoskeletal: M/S 5/5 throughout.  No deformity.  Neurologic: CN 2-12 intact. Pain and light touch intact in extremities.  Symmetrical.  Speech is fluent. Motor exam as listed above. Psychiatric: Judgment intact, Mood & affect appropriate for pt's clinical situation. Dermatologic: Venous rashes no ulcers noted.  No changes consistent with cellulitis. Lymph : No lichenification or skin changes of chronic lymphedema.  CBC Lab Results  Component Value Date    WBC 6.7 11/28/2020   HGB 13.7 11/28/2020   HCT 41.6 11/28/2020   MCV 89.1 11/28/2020   PLT 186 11/28/2020    BMET    Component Value Date/Time   NA 137 11/28/2020 0827   K 3.6 11/28/2020 0827   CL 104 11/28/2020 0827   CO2 22 11/28/2020 0827   GLUCOSE 79 11/28/2020 0827   BUN 23 (H) 11/28/2020 0827   CREATININE 0.70 11/28/2020 0827   CALCIUM 9.6 11/28/2020 0827   GFRNONAA >60 11/28/2020 0827   CrCl cannot be calculated (Patient's most recent lab result is older than the maximum 21 days allowed.).  COAG No results found for: "INR", "PROTIME"  Radiology No results found.   Assessment/Plan 1. Numbness and tingling (Primary) Recommend:  The patient has atypical pain symptoms for vascular disease and on exam I do not find evidence of vascular pathology that would  explain the patient's symptoms.  Noninvasive studies do not identify significant vascular problems  I suspect the patient is c/o pseudoclaudication.  Patient should have further evaluation of the LS spine which I defer to the Spine service.  Again, I have strongly encouraged her to follow-up with the spine service which she has seen in the past.  The patient should continue walking and begin a more formal exercise program. The patient should continue his antiplatelet therapy and aggressive treatment of the lipid abnormalities.  Patient will follow-up with me on a PRN basis.   A total of 35 minutes was spent with this patient and greater than 50% was spent in counseling and coordination of care with the patient.  Discussion included the treatment options for vascular disease including indications for surgery and intervention.  Also discussed is the appropriate timing of treatment.  In addition medical therapy was discussed.  2. Arthritis Continue medications to treat the patient's degenerative disease as already ordered, these medications have been reviewed and there are no changes at this time.  Continued activity  and therapy was stressed.    Devon Fogo, MD  02/17/2024 10:39 AM

## 2024-02-18 ENCOUNTER — Encounter (INDEPENDENT_AMBULATORY_CARE_PROVIDER_SITE_OTHER): Payer: Self-pay | Admitting: Vascular Surgery

## 2024-02-18 ENCOUNTER — Ambulatory Visit (INDEPENDENT_AMBULATORY_CARE_PROVIDER_SITE_OTHER): Admitting: Vascular Surgery

## 2024-02-18 ENCOUNTER — Ambulatory Visit (INDEPENDENT_AMBULATORY_CARE_PROVIDER_SITE_OTHER)

## 2024-02-18 VITALS — BP 110/74 | HR 59 | Resp 18 | Ht 67.0 in | Wt 152.8 lb

## 2024-02-18 DIAGNOSIS — R202 Paresthesia of skin: Secondary | ICD-10-CM

## 2024-02-18 DIAGNOSIS — M1990 Unspecified osteoarthritis, unspecified site: Secondary | ICD-10-CM | POA: Diagnosis not present

## 2024-02-18 DIAGNOSIS — R200 Anesthesia of skin: Secondary | ICD-10-CM

## 2024-02-21 ENCOUNTER — Encounter (INDEPENDENT_AMBULATORY_CARE_PROVIDER_SITE_OTHER): Payer: Self-pay | Admitting: Vascular Surgery
